# Patient Record
Sex: Male | Born: 1950 | Race: Black or African American | Hispanic: No | Marital: Married | Smoking: Former smoker
Health system: Southern US, Community
[De-identification: ages and names within clinical notes are randomized; demographics above are authoritative.]

## PROBLEM LIST (undated history)

## (undated) DIAGNOSIS — R7303 Prediabetes: Secondary | ICD-10-CM

## (undated) DIAGNOSIS — I10 Essential (primary) hypertension: Secondary | ICD-10-CM

## (undated) DIAGNOSIS — J189 Pneumonia, unspecified organism: Secondary | ICD-10-CM

## (undated) DIAGNOSIS — K219 Gastro-esophageal reflux disease without esophagitis: Secondary | ICD-10-CM

## (undated) DIAGNOSIS — M109 Gout, unspecified: Secondary | ICD-10-CM

## (undated) DIAGNOSIS — I2510 Atherosclerotic heart disease of native coronary artery without angina pectoris: Secondary | ICD-10-CM

## (undated) DIAGNOSIS — R609 Edema, unspecified: Secondary | ICD-10-CM

## (undated) DIAGNOSIS — Z8679 Personal history of other diseases of the circulatory system: Secondary | ICD-10-CM

## (undated) DIAGNOSIS — E669 Obesity, unspecified: Secondary | ICD-10-CM

## (undated) DIAGNOSIS — I739 Peripheral vascular disease, unspecified: Secondary | ICD-10-CM

## (undated) DIAGNOSIS — M1990 Unspecified osteoarthritis, unspecified site: Secondary | ICD-10-CM

## (undated) HISTORY — PX: OTHER SURGICAL HISTORY: SHX169

## (undated) HISTORY — PX: CYST REMOVAL TRUNK: SHX6283

## (undated) HISTORY — PX: CORONARY STENT PLACEMENT: SHX1402

## (undated) HISTORY — PX: FOOT SURGERY: SHX648

## (undated) HISTORY — DX: Obesity, unspecified: E66.9

## (undated) HISTORY — DX: Edema, unspecified: R60.9

---

## 1976-06-22 HISTORY — PX: OTHER SURGICAL HISTORY: SHX169

## 1976-06-22 HISTORY — PX: FOOT SURGERY: SHX648

## 1996-06-22 HISTORY — PX: CORONARY STENT PLACEMENT: SHX1402

## 1998-06-22 DIAGNOSIS — I219 Acute myocardial infarction, unspecified: Secondary | ICD-10-CM

## 1998-06-22 HISTORY — DX: Acute myocardial infarction, unspecified: I21.9

## 1999-01-14 ENCOUNTER — Inpatient Hospital Stay (HOSPITAL_COMMUNITY): Admission: AD | Admit: 1999-01-14 | Discharge: 1999-01-15 | Payer: Self-pay | Admitting: Cardiovascular Disease

## 1999-01-14 ENCOUNTER — Encounter: Payer: Self-pay | Admitting: Cardiovascular Disease

## 1999-03-03 ENCOUNTER — Ambulatory Visit (HOSPITAL_COMMUNITY): Admission: RE | Admit: 1999-03-03 | Discharge: 1999-03-03 | Payer: Self-pay | Admitting: Cardiovascular Disease

## 1999-03-03 ENCOUNTER — Encounter: Payer: Self-pay | Admitting: Cardiovascular Disease

## 1999-06-27 ENCOUNTER — Emergency Department (HOSPITAL_COMMUNITY): Admission: EM | Admit: 1999-06-27 | Discharge: 1999-06-27 | Payer: Self-pay | Admitting: *Deleted

## 2002-06-02 ENCOUNTER — Emergency Department (HOSPITAL_COMMUNITY): Admission: EM | Admit: 2002-06-02 | Discharge: 2002-06-03 | Payer: Self-pay | Admitting: Emergency Medicine

## 2003-08-15 ENCOUNTER — Inpatient Hospital Stay (HOSPITAL_COMMUNITY): Admission: EM | Admit: 2003-08-15 | Discharge: 2003-08-16 | Payer: Self-pay | Admitting: Emergency Medicine

## 2005-03-27 ENCOUNTER — Inpatient Hospital Stay (HOSPITAL_COMMUNITY): Admission: EM | Admit: 2005-03-27 | Discharge: 2005-03-29 | Payer: Self-pay | Admitting: *Deleted

## 2005-09-22 ENCOUNTER — Encounter (HOSPITAL_BASED_OUTPATIENT_CLINIC_OR_DEPARTMENT_OTHER): Admission: RE | Admit: 2005-09-22 | Discharge: 2005-11-18 | Payer: Self-pay | Admitting: Surgery

## 2005-10-01 ENCOUNTER — Ambulatory Visit: Admission: RE | Admit: 2005-10-01 | Discharge: 2005-10-01 | Payer: Self-pay | Admitting: Surgery

## 2012-04-11 ENCOUNTER — Encounter: Payer: Medicaid Other | Attending: Internal Medicine | Admitting: *Deleted

## 2012-04-11 ENCOUNTER — Encounter: Payer: Self-pay | Admitting: *Deleted

## 2012-04-11 VITALS — Ht 69.0 in | Wt 273.0 lb

## 2012-04-11 DIAGNOSIS — E1159 Type 2 diabetes mellitus with other circulatory complications: Secondary | ICD-10-CM

## 2012-04-11 DIAGNOSIS — Z713 Dietary counseling and surveillance: Secondary | ICD-10-CM | POA: Insufficient documentation

## 2012-04-11 DIAGNOSIS — E119 Type 2 diabetes mellitus without complications: Secondary | ICD-10-CM | POA: Insufficient documentation

## 2012-04-11 NOTE — Progress Notes (Signed)
Medical Nutrition Therapy:  Appt start time: 1030 end time:  1130.  Assessment:  Primary concerns today: Type 2 diabetes. Patient recently diagnosed with diabetes. No prior education. He reports he has made some healthy changes in order to lose weight. He does not currently check blood glucose, but wife has meter. HgbA1c 6.6. His total and LDL cholesterol are elevated. He is limited in physical activity due to foot pain, but would like to start biking.   WEIGHT: 273 pounds BMI 40.4  MEDICATIONS: lasix, metoprolol, lisinopril, omeprazole, Klor-Con Patient has not filled diabetes medication prescription at this time. He is unable to provide the name, but likely metformin.    DIETARY INTAKE:   Usual eating pattern includes 2-3 meals and 1-2 snacks per day.  24-hr recall:  B ( AM): Skips, green tea, bacon, eggs occationally  Snk ( AM): None  L ( PM): Large salad (dressing, tomatoes, deli meat), unsweetened tea Snk ( PM): hard candies, canned pineapple D ( PM): pork chops (x3), greens, pintos/black eyed peas/potatoes, water Snk ( PM): pineapple Beverages: unsweetened tea, water  Usual physical activity: None  Estimated energy needs: 2000 calories 225 g carbohydrates 150 g protein 56 g fat  Progress Towards Goal(s):  In progress.   Nutritional Diagnosis:  NB-1.1 Food and nutrition-related knowledge deficit As related to diabetes.  As evidenced by no prior education.    Intervention:  Nutrition counseling. We discussed basic carb counting, including foods with carbs, label reading, portion size, and meal planning. We also discussed strategies for weight loss and heart healthy eating.   Goals:  1. 3-4 carbohydrate servings at meals, 1 serving at snacks. 2. Monitor portion size of carbohydrate containing foods.  3. Reduce saturated fat intake by limiting meat portions.  4. Continue to eat at least 3 servings of vegetables daily.  5. Work up to biking at least 3 days a week for 30  minutes.   Handouts given during visit include:  Carbohydrate counting booklet  Monitoring/Evaluation:  Dietary intake, exercise, blood glucose, and body weight in 1 month(s).

## 2012-04-11 NOTE — Patient Instructions (Signed)
Goals:  1. 3-4 carbohydrate servings at meals, 1 serving at snacks. 2. Monitor portion size of carbohydrate containing foods.  3. Reduce saturated fat intake by limiting meat portions.  4. Continue to eat at least 3 servings of vegetables daily.  5. Work up to biking at least 3 days a week for 30 minutes.

## 2012-05-04 ENCOUNTER — Emergency Department (HOSPITAL_COMMUNITY): Payer: Medicaid Other

## 2012-05-04 ENCOUNTER — Encounter (HOSPITAL_COMMUNITY): Payer: Self-pay | Admitting: Emergency Medicine

## 2012-05-04 ENCOUNTER — Emergency Department (HOSPITAL_COMMUNITY)
Admission: EM | Admit: 2012-05-04 | Discharge: 2012-05-05 | Disposition: A | Discharge status: Home / Self Care | Payer: Medicaid Other | Attending: Emergency Medicine | Admitting: Emergency Medicine

## 2012-05-04 DIAGNOSIS — R748 Abnormal levels of other serum enzymes: Secondary | ICD-10-CM | POA: Insufficient documentation

## 2012-05-04 DIAGNOSIS — I10 Essential (primary) hypertension: Secondary | ICD-10-CM | POA: Insufficient documentation

## 2012-05-04 DIAGNOSIS — I2510 Atherosclerotic heart disease of native coronary artery without angina pectoris: Secondary | ICD-10-CM | POA: Insufficient documentation

## 2012-05-04 DIAGNOSIS — R7989 Other specified abnormal findings of blood chemistry: Secondary | ICD-10-CM

## 2012-05-04 DIAGNOSIS — Z79899 Other long term (current) drug therapy: Secondary | ICD-10-CM | POA: Insufficient documentation

## 2012-05-04 DIAGNOSIS — R079 Chest pain, unspecified: Secondary | ICD-10-CM

## 2012-05-04 DIAGNOSIS — Z87891 Personal history of nicotine dependence: Secondary | ICD-10-CM | POA: Insufficient documentation

## 2012-05-04 DIAGNOSIS — E119 Type 2 diabetes mellitus without complications: Secondary | ICD-10-CM | POA: Insufficient documentation

## 2012-05-04 DIAGNOSIS — Z7982 Long term (current) use of aspirin: Secondary | ICD-10-CM | POA: Insufficient documentation

## 2012-05-04 HISTORY — DX: Atherosclerotic heart disease of native coronary artery without angina pectoris: I25.10

## 2012-05-04 HISTORY — DX: Essential (primary) hypertension: I10

## 2012-05-04 LAB — CBC WITH DIFFERENTIAL/PLATELET
Basophils Absolute: 0 10*3/uL (ref 0.0–0.1)
Eosinophils Absolute: 0.1 10*3/uL (ref 0.0–0.7)
Eosinophils Relative: 1 % (ref 0–5)
Lymphocytes Relative: 30 % (ref 12–46)
MCV: 82.3 fL (ref 78.0–100.0)
Neutrophils Relative %: 61 % (ref 43–77)
Platelets: 300 10*3/uL (ref 150–400)
RDW: 13.7 % (ref 11.5–15.5)
WBC: 9.1 10*3/uL (ref 4.0–10.5)

## 2012-05-04 LAB — BASIC METABOLIC PANEL
CO2: 27 mEq/L (ref 19–32)
Calcium: 9.9 mg/dL (ref 8.4–10.5)
GFR calc non Af Amer: 28 mL/min — ABNORMAL LOW (ref >90)
Potassium: 3.6 mEq/L (ref 3.5–5.1)
Sodium: 133 mEq/L — ABNORMAL LOW (ref 135–145)

## 2012-05-04 LAB — TROPONIN I: Troponin I: <0.3 ng/mL (ref <0.30)

## 2012-05-04 LAB — POCT I-STAT TROPONIN I

## 2012-05-04 NOTE — ED Notes (Signed)
Pt sts episode of CP today this am with resolution; pt denies SOB or nausea; pt sts feels "out of place" or weak today

## 2012-05-04 NOTE — ED Provider Notes (Signed)
History     CSN: [id number]  Arrival date & time 05/04/12  4098   First MD Initiated Contact with Patient 05/04/12 2116      Chief Complaint  Patient presents with  . Chest Pain    (Consider location/radiation/quality/duration/timing/severity/associated sxs/prior treatment) The history is provided by the patient.  Christian Evans is a 61 y.o. male hx of CAD s/p 1 stent, DM, HTN here with chest pain. Substernal chest pain lasting 15-20 minutes this AM. He thought it was gas so took some medicines and felt better. Denies SOB or nausea or diaphoresis. Felt weak this PM so wanted to get checked out. He has been following up with his cardiologist and had a normal cardiac cath several years ago. No fever or chills or cough. Pain free now and he took his ASA 325 mg today.    Past Medical History  Diagnosis Date  . Coronary artery disease   . Diabetes mellitus without complication   . Hypertension     History reviewed. No pertinent past surgical history.  History reviewed. No pertinent family history.  History  Substance Use Topics  . Smoking status: Former Games developer  . Smokeless tobacco: Not on file  . Alcohol Use: Yes     Comment: occ      Review of Systems  Cardiovascular: Positive for chest pain.  All other systems reviewed and are negative.    Allergies  Review of patient's allergies indicates no known allergies.  Home Medications   Current Outpatient Rx  Name  Route  Sig  Dispense  Refill  . ASPIRIN 325 MG PO TABS   Oral   Take 325 mg by mouth daily.         Marland Kitchen CLONIDINE HCL 0.1 MG PO TABS   Oral   Take 0.1 mg by mouth 2 (two) times daily.         Marland Kitchen FELODIPINE ER 10 MG PO TB24   Oral   Take 10 mg by mouth daily.         . FUROSEMIDE 20 MG PO TABS   Oral   Take 20 mg by mouth daily.         Marland Kitchen LISINOPRIL-HYDROCHLOROTHIAZIDE 20-25 MG PO TABS   Oral   Take 1 tablet by mouth daily.         Marland Kitchen METHOCARBAMOL 500 MG PO TABS   Oral   Take 500 mg  by mouth 3 (three) times daily.         Marland Kitchen METOPROLOL SUCCINATE ER 25 MG PO TB24   Oral   Take 25 mg by mouth daily.         Marland Kitchen OMEPRAZOLE 20 MG PO CPDR   Oral   Take 20 mg by mouth daily.         Marland Kitchen POTASSIUM CHLORIDE CRYS ER 20 MEQ PO TBCR   Oral   Take 20 mEq by mouth 2 (two) times daily.         . TESTOSTERONE 2.5 MG/24HR TD PT24   Transdermal   Place 1 patch onto the skin daily.           BP 142/82  Pulse 85  Temp 97.9 F (36.6 C) (Oral)  Resp 18  SpO2 98%  Physical Exam  Nursing note and vitals reviewed. Constitutional: He appears well-developed and well-nourished.       NAD, calm   HENT:  Head: Normocephalic.  Mouth/Throat: Oropharynx is clear and moist.  Eyes: Conjunctivae normal  are normal. Pupils are equal, round, and reactive to light.  Neck: Normal range of motion. Neck supple.  Cardiovascular: Normal rate, regular rhythm and normal heart sounds.   Pulmonary/Chest: Effort normal and breath sounds normal. No respiratory distress. He has no wheezes.  Abdominal: Soft. Bowel sounds are normal. He exhibits no distension. There is no tenderness. There is no rebound.  Musculoskeletal: Normal range of motion. He exhibits edema. He exhibits no tenderness.       1+ edema, chronic   Neurological: He is alert.  Skin: Skin is warm and dry.  Psychiatric: He has a normal mood and affect. His behavior is normal. Judgment and thought content normal.    ED Course  Procedures (including critical care time)  Labs Reviewed  BASIC METABOLIC PANEL - Abnormal; Notable for the following:    Sodium 133 (*)     Chloride 92 (*)     BUN 42 (*)     Creatinine, Ser 2.40 (*)     GFR calc non Af Amer 28 (*)     GFR calc Af Amer 32 (*)     All other components within normal limits  CBC WITH DIFFERENTIAL  POCT I-STAT TROPONIN I  TROPONIN I   Dg Chest 2 View  05/04/2012  *RADIOLOGY REPORT*  Clinical Data: Chest pain.  CHEST - 2 VIEW  Comparison: Two-view chest  03/26/2005.  Findings: Heart size is normal.  Lungs are clear.  Mild degenerative changes in the thoracic spine are similar to the prior exam.  IMPRESSION: No acute cardiopulmonary disease or significant interval change.   Original Report Authenticated By: Marin Roberts, M.D.      No diagnosis found.   Date: 05/04/2012  Rate: 81  Rhythm: normal sinus rhythm  QRS Axis: normal  Intervals: normal  ST/T Wave abnormalities: nonspecific ST changes  Conduction Disutrbances:none  Narrative Interpretation:   Old EKG Reviewed: none available     MDM  Christian Evans is a 61 y.o. male hx of CAD s/p 1 stent, DM here with chest pain that resolved. I counseled patient regarding options. Since he has follow up and is pain free and has nl EKG, will recommend Trop x 2 and outpatient f/u if negative.   11:12 PM Trop neg x 2. CXR nl. Labs show Cr 2.4 (unknown baseline). Will have him follow up with his doctor outpatient.        Richardean Canal, MD 05/04/12 [id number]

## 2012-05-09 ENCOUNTER — Ambulatory Visit: Payer: Medicaid Other | Admitting: *Deleted

## 2012-05-23 ENCOUNTER — Ambulatory Visit: Payer: Medicaid Other | Admitting: *Deleted

## 2012-06-06 ENCOUNTER — Encounter: Payer: Medicaid Other | Attending: Internal Medicine | Admitting: *Deleted

## 2012-06-06 ENCOUNTER — Encounter: Payer: Self-pay | Admitting: *Deleted

## 2012-06-06 VITALS — Ht 69.0 in | Wt 269.5 lb

## 2012-06-06 DIAGNOSIS — E119 Type 2 diabetes mellitus without complications: Secondary | ICD-10-CM | POA: Insufficient documentation

## 2012-06-06 DIAGNOSIS — Z713 Dietary counseling and surveillance: Secondary | ICD-10-CM | POA: Insufficient documentation

## 2012-06-06 NOTE — Progress Notes (Signed)
Medical Nutrition Therapy:  Appt start time: 0830 end time:  0900.  Assessment:  Patient returns today for a diabetes management follow up. He reports that he has been monitoring carbohydrate intake and portion size of foods. He states that he got off track over Thanksgiving, but is back on track now. His weight is down to 269.6 pounds from 273 pounds 2 months ago. He checks his blood glucose twice daily, in the morning and 2 hours after dinner. Usual readings are 87-127.   MEDICATIONS: lasix, metoprolol, lisinopril, omeprazole, Klor-Con No diabetes medications at this time.    DIETARY INTAKE:   Usual eating pattern includes 3 meals and 1-2 snacks per day.  Usual physical activity: None currently, but would like to buy a bike  Estimated energy needs: 2000 calories  225 g carbohydrates  150 g protein  56 g fat  Progress Towards Goal(s):  Some progress.   Nutritional Diagnosis:  NB-1.1 Food and nutrition-related knowledge deficit As related to diabetes. As evidenced by no prior education.    Intervention:  Nutrition counseling. We reviewed basic carb counting principles. I reinforced the positive changes the patient has made, and discussed adding physical activity to his daily life. We also discussed medications and blood glucose monitoring.   Goals:  1. 3-4 carbohydrate servings at meals, 1 serving at snacks.  2. Monitor portion size of carbohydrate containing foods.  3. Reduce saturated fat intake by limiting meat portions.  4. Continue to eat at least 3 servings of vegetables daily.  5. Work up to biking at least 3 days a week for 30 minutes.   Monitoring/Evaluation:  Dietary intake, exercise, blood glucose, and body weight in 2 month(s).

## 2012-07-25 ENCOUNTER — Ambulatory Visit: Payer: Medicaid Other | Admitting: *Deleted

## 2012-08-08 ENCOUNTER — Ambulatory Visit: Payer: Medicaid Other | Admitting: *Deleted

## 2013-05-28 ENCOUNTER — Emergency Department (HOSPITAL_COMMUNITY)
Admission: EM | Admit: 2013-05-28 | Discharge: 2013-05-28 | Disposition: A | Discharge status: Home / Self Care | Payer: BC Managed Care – PPO | Source: Home / Self Care

## 2013-05-28 ENCOUNTER — Encounter (HOSPITAL_COMMUNITY): Payer: Self-pay | Admitting: Emergency Medicine

## 2013-05-28 DIAGNOSIS — T148XXA Other injury of unspecified body region, initial encounter: Secondary | ICD-10-CM

## 2013-05-28 DIAGNOSIS — R079 Chest pain, unspecified: Secondary | ICD-10-CM

## 2013-05-28 DIAGNOSIS — R0781 Pleurodynia: Secondary | ICD-10-CM

## 2013-05-28 DIAGNOSIS — I10 Essential (primary) hypertension: Secondary | ICD-10-CM

## 2013-05-28 HISTORY — DX: Gastro-esophageal reflux disease without esophagitis: K21.9

## 2013-05-28 HISTORY — DX: Personal history of other diseases of the circulatory system: Z86.79

## 2013-05-28 HISTORY — DX: Gout, unspecified: M10.9

## 2013-05-28 MED ORDER — TRAMADOL HCL 50 MG PO TABS: 50.0000 mg | ORAL_TABLET | Freq: Four times a day (QID) | ORAL | Status: DC | PRN

## 2013-05-28 NOTE — ED Provider Notes (Signed)
Medical screening examination/treatment/procedure(s) were performed by resident physician or non-physician practitioner and as supervising physician I was immediately available for consultation/collaboration.   Khristy Kalan DOUGLAS MD.   Mickey Hebel D Taygen Acklin, MD 05/28/13 1412 

## 2013-05-28 NOTE — ED Provider Notes (Signed)
CSN: [id number]     Arrival date & time 05/28/13  0940 History   First MD Initiated Contact with Patient 05/28/13 1010     Chief Complaint  Patient presents with  . Chest Pain  . Back Pain   (Consider location/radiation/quality/duration/timing/severity/associated sxs/prior Treatment) HPI Comments: As above. Sudden onset of pain in bilateral ribs when reaching backwards with R arm 3 d ago. Pain located along the L and right ribs along the MAL. Elicited with movement of arms and torso. Abated with arms down and relaxed. No anterior chest discomfort. No Heaviness, tightness, fullness or pressure, dyspnea, diaphoresis, weakness.   Past Medical History  Diagnosis Date  . Coronary artery disease   . Hypertension   . GERD (gastroesophageal reflux disease)   . Gout   . Hx of angina pectoris    Past Surgical History  Procedure Laterality Date  . Coronary stent placement    . Foot surgery    . Arm surgery     No family history on file. History  Substance Use Topics  . Smoking status: Former Games developer  . Smokeless tobacco: Not on file  . Alcohol Use: No    Review of Systems  Constitutional: Negative.  Negative for fever, chills, diaphoresis, activity change, appetite change and fatigue.  HENT: Negative.   Respiratory: Negative.  Negative for cough, choking, chest tightness, shortness of breath, wheezing and stridor.   Cardiovascular: Positive for leg swelling. Negative for chest pain and palpitations.       Edema bilat LE is chronic per PCP notes.  Gastrointestinal: Negative.   Genitourinary: Negative.   Musculoskeletal: Negative for neck pain.       As per HPI  Skin: Negative.   Neurological: Negative for dizziness, weakness, numbness and headaches.    Allergies  Review of patient's allergies indicates no known allergies.  Home Medications   Current Outpatient Rx  Name  Route  Sig  Dispense  Refill  . aspirin 325 MG tablet   Oral   Take 325 mg by mouth daily.          . cloNIDine (CATAPRES) 0.1 MG tablet   Oral   Take 0.1 mg by mouth 2 (two) times daily.         . felodipine (PLENDIL) 10 MG 24 hr tablet   Oral   Take 10 mg by mouth daily.         Marland Kitchen lisinopril-hydrochlorothiazide (PRINZIDE,ZESTORETIC) 20-25 MG per tablet   Oral   Take 1 tablet by mouth daily.         . metoprolol succinate (TOPROL-XL) 25 MG 24 hr tablet   Oral   Take 25 mg by mouth daily.         . furosemide (LASIX) 20 MG tablet   Oral   Take 20 mg by mouth daily.         . methocarbamol (ROBAXIN) 500 MG tablet   Oral   Take 500 mg by mouth 3 (three) times daily.         Marland Kitchen omeprazole (PRILOSEC) 20 MG capsule   Oral   Take 20 mg by mouth daily.         . potassium chloride SA (K-DUR,KLOR-CON) 20 MEQ tablet   Oral   Take 20 mEq by mouth 2 (two) times daily.         Marland Kitchen testosterone (ANDRODERM) 2.5 MG/24HR   Transdermal   Place 1 patch onto the skin daily.         Marland Kitchen  traMADol (ULTRAM) 50 MG tablet   Oral   Take 1 tablet (50 mg total) by mouth every 6 (six) hours as needed.   20 tablet   0    BP 171/100  Pulse 61  Temp(Src) 98 F (36.7 C) (Oral)  Resp 18  SpO2 97% Physical Exam  Nursing note and vitals reviewed. Constitutional: He is oriented to person, place, and time. He appears well-developed and well-nourished. No distress.  HENT:  Head: Normocephalic and atraumatic.  Eyes: Conjunctivae and EOM are normal. Left eye exhibits no discharge.  Neck: Normal range of motion. Neck supple.  Cardiovascular: Normal rate, regular rhythm, normal heart sounds and intact distal pulses.   Pulmonary/Chest: Effort normal and breath sounds normal. No respiratory distress. He has no wheezes. He has no rales. He exhibits tenderness.  Tenderness along the lower bilateral ribs primarily below the axillae and MAL. No mid back tenderness or pain.   Musculoskeletal: Normal range of motion. He exhibits tenderness.  Lymphadenopathy:    He has no cervical  adenopathy.  Neurological: He is alert and oriented to person, place, and time. No cranial nerve deficit.  Skin: Skin is warm and dry.  Psychiatric: He has a normal mood and affect.    ED Course  Procedures (including critical care time) Labs Review Labs Reviewed - No data to display Imaging Review No results found.  EKG: NSR, No ectopy. No change from November EKG.  MDM   1. Rib pain   2. Muscle strain   3. HTN (hypertension)     Pt D/C in good condition. There are no cardiopulmonary sx's.  Tramadol for pain , may combine with ibuprofen or tylenol. See your doctor as needed.     Hayden Rasmussen, NP 05/28/13 1108  Hayden Rasmussen, NP 05/28/13 1109

## 2013-05-28 NOTE — ED Notes (Signed)
States reached back with right arm 3 days ago and felt sudden onset right shoulder pain.  Constant pain radiates down and around ribcage and around to lateral mid-back.  Pain is aggravated by any movement, twisting, or reaching up with arms.  Denies any SOB, nausea, or diaphoresis associated with pain.  Pt guarded with his movements.  Has tried IBU, but no relief.  Had difficulty getting out of bed.  Pt has not had to use NTG.

## 2013-06-01 ENCOUNTER — Emergency Department (INDEPENDENT_AMBULATORY_CARE_PROVIDER_SITE_OTHER)
Admission: EM | Admit: 2013-06-01 | Discharge: 2013-06-01 | Disposition: A | Discharge status: Home / Self Care | Payer: BC Managed Care – PPO | Source: Home / Self Care | Attending: Emergency Medicine | Admitting: Emergency Medicine

## 2013-06-01 ENCOUNTER — Encounter (HOSPITAL_COMMUNITY): Payer: Self-pay | Admitting: Emergency Medicine

## 2013-06-01 DIAGNOSIS — J069 Acute upper respiratory infection, unspecified: Secondary | ICD-10-CM

## 2013-06-01 MED ORDER — BENZONATATE 100 MG PO CAPS: 100.0000 mg | ORAL_CAPSULE | Freq: Three times a day (TID) | ORAL | Status: DC

## 2013-06-01 NOTE — ED Provider Notes (Signed)
Medical screening examination/treatment/procedure(s) were performed by non-physician practitioner and as supervising physician I was immediately available for consultation/collaboration.  Leslee Home, M.D.  Reuben Likes, MD 06/01/13 2152

## 2013-06-01 NOTE — ED Provider Notes (Signed)
CSN: [id number]     Arrival date & time 06/01/13  1655 History   First MD Initiated Contact with Patient 06/01/13 1712     Chief Complaint  Patient presents with  . Cough   (Consider location/radiation/quality/duration/timing/severity/associated sxs/prior Treatment) Patient is a 62 y.o. male presenting with URI. The history is provided by the patient.  URI Presenting symptoms: congestion and cough   Presenting symptoms: no fever, no rhinorrhea and no sore throat   Severity:  Mild Onset quality:  Gradual Duration:  2 days Progression:  Unchanged Chronicity:  New Associated symptoms: no headaches and no wheezing   Risk factors: sick contacts   Risk factors comment:  Was recently caring for several children who were ill with URI symptoms   Past Medical History  Diagnosis Date  . Coronary artery disease   . Hypertension   . GERD (gastroesophageal reflux disease)   . Gout   . Hx of angina pectoris    Past Surgical History  Procedure Laterality Date  . Coronary stent placement    . Foot surgery    . Arm surgery     No family history on file. History  Substance Use Topics  . Smoking status: Former Games developer  . Smokeless tobacco: Not on file  . Alcohol Use: No    Review of Systems  Constitutional: Negative for fever.  HENT: Positive for congestion. Negative for rhinorrhea and sore throat.   Eyes: Negative.   Respiratory: Positive for cough. Negative for chest tightness, shortness of breath and wheezing.   Cardiovascular: Negative.   Gastrointestinal: Negative.   Endocrine: Negative for polydipsia, polyphagia and polyuria.  Genitourinary: Negative.   Musculoskeletal: Negative.   Skin: Negative.   Allergic/Immunologic: Negative for immunocompromised state.  Neurological: Negative for dizziness, weakness and headaches.  Psychiatric/Behavioral: Negative.     Allergies  Review of patient's allergies indicates no known allergies.  Home Medications   Current Outpatient  Rx  Name  Route  Sig  Dispense  Refill  . aspirin 325 MG tablet   Oral   Take 325 mg by mouth daily.         . cloNIDine (CATAPRES) 0.1 MG tablet   Oral   Take 0.1 mg by mouth 2 (two) times daily.         . felodipine (PLENDIL) 10 MG 24 hr tablet   Oral   Take 10 mg by mouth daily.         . furosemide (LASIX) 20 MG tablet   Oral   Take 20 mg by mouth daily.         Marland Kitchen lisinopril-hydrochlorothiazide (PRINZIDE,ZESTORETIC) 20-25 MG per tablet   Oral   Take 1 tablet by mouth daily.         . methocarbamol (ROBAXIN) 500 MG tablet   Oral   Take 500 mg by mouth 3 (three) times daily.         . metoprolol succinate (TOPROL-XL) 25 MG 24 hr tablet   Oral   Take 25 mg by mouth daily.         Marland Kitchen omeprazole (PRILOSEC) 20 MG capsule   Oral   Take 20 mg by mouth daily.         . potassium chloride SA (K-DUR,KLOR-CON) 20 MEQ tablet   Oral   Take 20 mEq by mouth 2 (two) times daily.         Marland Kitchen testosterone (ANDRODERM) 2.5 MG/24HR   Transdermal   Place 1 patch onto the skin  daily.         . traMADol (ULTRAM) 50 MG tablet   Oral   Take 1 tablet (50 mg total) by mouth every 6 (six) hours as needed.   20 tablet   0    BP 148/81  Pulse 95  Temp(Src) 97.8 F (36.6 C) (Oral)  Resp 16  SpO2 95% Physical Exam  Nursing note and vitals reviewed. Constitutional: He is oriented to person, place, and time. He appears well-developed and well-nourished. No distress.  +obese   HENT:  Head: Normocephalic and atraumatic.  Right Ear: Hearing, tympanic membrane, external ear and ear canal normal.  Left Ear: Hearing, tympanic membrane, external ear and ear canal normal.  Nose: Nose normal.  Mouth/Throat: Uvula is midline, oropharynx is clear and moist and mucous membranes are normal.  Eyes: Conjunctivae are normal. Right eye exhibits no discharge. Left eye exhibits no discharge. No scleral icterus.  Neck: Normal range of motion. Neck supple.  Cardiovascular: Normal  rate, regular rhythm and normal heart sounds.   Pulmonary/Chest: Effort normal and breath sounds normal.  Abdominal: Soft. Bowel sounds are normal.  Musculoskeletal: Normal range of motion.  Lymphadenopathy:    He has no cervical adenopathy.  Neurological: He is alert and oriented to person, place, and time.  Skin: Skin is warm and dry. No rash noted.  Psychiatric: He has a normal mood and affect. His behavior is normal.    ED Course  Procedures (including critical care time) Labs Review Labs Reviewed - No data to display Imaging Review No results found.  EKG Interpretation    Date/Time:    Ventricular Rate:    PR Interval:    QRS Duration:   QT Interval:    QTC Calculation:   R Axis:     Text Interpretation:              MDM   Patient educated regarding symptomatic care at home and the use of OTC cough and cold products and advised to return if fever or dyspnea. Advised to get his 2014 flu shot once he begin to feel better.    Jess Barters [place], [place] 06/01/13 1816

## 2013-06-01 NOTE — ED Notes (Signed)
Seen  3  Days  Ago  For  Muscle    Strain        -  Now  Developed  A  Cough     Today            Head  Stuffy     Nasal  Congestion

## 2013-09-04 ENCOUNTER — Emergency Department (HOSPITAL_COMMUNITY)
Admission: EM | Admit: 2013-09-04 | Discharge: 2013-09-04 | Disposition: A | Discharge status: Home / Self Care | Payer: BC Managed Care – PPO | Source: Home / Self Care

## 2013-09-04 DIAGNOSIS — M7541 Impingement syndrome of right shoulder: Secondary | ICD-10-CM

## 2013-09-04 DIAGNOSIS — M7580 Other shoulder lesions, unspecified shoulder: Secondary | ICD-10-CM

## 2013-09-04 DIAGNOSIS — M25819 Other specified joint disorders, unspecified shoulder: Secondary | ICD-10-CM

## 2013-09-04 MED ORDER — MELOXICAM 15 MG PO TABS: 15.0000 mg | ORAL_TABLET | Freq: Every day | ORAL | Status: DC

## 2013-09-04 MED ORDER — TRAMADOL HCL 50 MG PO TABS: 50.0000 mg | ORAL_TABLET | Freq: Four times a day (QID) | ORAL | Status: DC | PRN

## 2013-09-04 MED ORDER — TRIAMCINOLONE ACETONIDE 40 MG/ML IJ SUSP
INTRAMUSCULAR | Status: AC
  Filled 2013-09-04: qty 1

## 2013-09-04 NOTE — ED Notes (Signed)
63 yr old is here with complaints of Right shoulder pain x 4 wks. He states he has not fallen on injuired his right shoulder. He states he has tried OTC Tylenol with no relief.  Denies: SOB, chest pain, fever

## 2013-09-04 NOTE — Discharge Instructions (Signed)

## 2013-09-04 NOTE — ED Provider Notes (Signed)
CSN: [id number]     Arrival date & time 09/04/13  [id number] History   First MD Initiated Contact with Patient 09/04/13 [phone]     No chief complaint on file.  (Consider location/radiation/quality/duration/timing/severity/associated sxs/prior Treatment) HPI Comments: 63 year old male complaining of pain to the right shoulder. The pain is elicited with abduction. Abduction is limited to approximately 80-90 due to pain. The initial symptoms occurred approximately 4 weeks ago. There is no history of trauma and he is unaware of any specific causative condition.   Past Medical History  Diagnosis Date  . Coronary artery disease   . Hypertension   . GERD (gastroesophageal reflux disease)   . Gout   . Hx of angina pectoris    Past Surgical History  Procedure Laterality Date  . Coronary stent placement    . Foot surgery    . Arm surgery     No family history on file. History  Substance Use Topics  . Smoking status: Former Games developermoker  . Smokeless tobacco: Not on file  . Alcohol Use: No    Review of Systems  Constitutional: Negative.   Respiratory: Negative.   Gastrointestinal: Negative.   Genitourinary: Negative.   Musculoskeletal: Negative for neck pain and neck stiffness.       As per HPI  Skin: Negative.   Neurological: Negative for dizziness, weakness, numbness and headaches.    Allergies  Review of patient's allergies indicates no known allergies.  Home Medications   Current Outpatient Rx  Name  Route  Sig  Dispense  Refill  . allopurinol (ZYLOPRIM) 300 MG tablet   Oral   Take 300 mg by mouth daily.         Marland Kitchen. aspirin 325 MG tablet   Oral   Take 325 mg by mouth daily.         . cloNIDine (CATAPRES) 0.1 MG tablet   Oral   Take 0.1 mg by mouth 2 (two) times daily.         . felodipine (PLENDIL) 10 MG 24 hr tablet   Oral   Take 10 mg by mouth daily.         Marland Kitchen. lisinopril-hydrochlorothiazide (PRINZIDE,ZESTORETIC) 20-25 MG per tablet   Oral   Take 1 tablet by mouth  daily.         . metoprolol succinate (TOPROL-XL) 25 MG 24 hr tablet   Oral   Take 25 mg by mouth daily.         . furosemide (LASIX) 20 MG tablet   Oral   Take 20 mg by mouth daily.         . meloxicam (MOBIC) 15 MG tablet   Oral   Take 1 tablet (15 mg total) by mouth daily.   14 tablet   0   . potassium chloride SA (K-DUR,KLOR-CON) 20 MEQ tablet   Oral   Take 20 mEq by mouth 2 (two) times daily.         . traMADol (ULTRAM) 50 MG tablet   Oral   Take 1 tablet (50 mg total) by mouth every 6 (six) hours as needed.   15 tablet   0    BP 146/85  Pulse 60  Temp(Src) 97.7 F (36.5 C) (Oral)  Resp 18  SpO2 98% Physical Exam  Nursing note and vitals reviewed. Constitutional: He is oriented to person, place, and time. He appears well-developed and well-nourished.  HENT:  Head: Normocephalic and atraumatic.  Eyes: EOM are normal. Left eye exhibits no  discharge.  Neck: Normal range of motion. Neck supple.  Pulmonary/Chest: Effort normal. No respiratory distress.  Musculoskeletal:  Tenderness over the right a.c. joint, but no weight and uppermost deltoid insertion points. Abduction is limited to 80-90. External and internal rotation also produces pain. Distal neurovascular and motor sensory is intact. 2+ radial pulse.  Neurological: He is alert and oriented to person, place, and time. No cranial nerve deficit.  Skin: Skin is warm and dry.  Psychiatric: He has a normal mood and affect.    ED Course  Injection of joint Date/Time: 09/04/2013 10:29 AM Performed by: Phineas Real, Debra Calabretta Authorized by: Leslee Home C Consent: Verbal consent obtained. Risks and benefits: risks, benefits and alternatives were discussed Consent given by: patient Patient understanding: patient states understanding of the procedure being performed Patient identity confirmed: verbally with patient Local anesthesia used: yes Anesthesia: local infiltration Local anesthetic: bupivacaine 0.5% without  epinephrine Anesthetic total: 3 ml Patient sedated: no Patient tolerance: Patient tolerated the procedure well with no immediate complications. Comments: Kenalog 40 mg and Marcaine 3 cc injected into the right a.c. joint.   (including critical care time) Labs Review Labs Reviewed - No data to display Imaging Review No results found.   MDM   1. Impingement syndrome of right shoulder region      Injectin of Kenalog 40 mg and Marcaine 3 cc as above Mobic 15 mg Passive ROM of shoulder Instructions for condition See your PCP next week as needed    Hayden Rasmussen, NP 09/04/13 1030

## 2013-09-04 NOTE — ED Provider Notes (Signed)
Medical screening examination/treatment/procedure(s) were performed by non-physician practitioner and as supervising physician I was immediately available for consultation/collaboration.  Leslee Homeavid Salimatou Simone, M.D.  Reuben Likesavid C Nicholl Onstott, MD 09/04/13 [phone]

## 2014-11-15 ENCOUNTER — Emergency Department (HOSPITAL_COMMUNITY)
Admission: EM | Admit: 2014-11-15 | Discharge: 2014-11-16 | Disposition: A | Discharge status: Home / Self Care | Payer: Medicare HMO | Attending: Emergency Medicine | Admitting: Emergency Medicine

## 2014-11-15 ENCOUNTER — Encounter (HOSPITAL_COMMUNITY): Payer: Self-pay | Admitting: Emergency Medicine

## 2014-11-15 ENCOUNTER — Emergency Department (HOSPITAL_COMMUNITY): Payer: Medicare HMO

## 2014-11-15 DIAGNOSIS — Z87891 Personal history of nicotine dependence: Secondary | ICD-10-CM | POA: Diagnosis not present

## 2014-11-15 DIAGNOSIS — Z8719 Personal history of other diseases of the digestive system: Secondary | ICD-10-CM | POA: Insufficient documentation

## 2014-11-15 DIAGNOSIS — I10 Essential (primary) hypertension: Secondary | ICD-10-CM | POA: Insufficient documentation

## 2014-11-15 DIAGNOSIS — R079 Chest pain, unspecified: Secondary | ICD-10-CM | POA: Diagnosis present

## 2014-11-15 DIAGNOSIS — M109 Gout, unspecified: Secondary | ICD-10-CM | POA: Insufficient documentation

## 2014-11-15 DIAGNOSIS — Z7982 Long term (current) use of aspirin: Secondary | ICD-10-CM | POA: Diagnosis not present

## 2014-11-15 DIAGNOSIS — I25119 Atherosclerotic heart disease of native coronary artery with unspecified angina pectoris: Secondary | ICD-10-CM | POA: Diagnosis not present

## 2014-11-15 DIAGNOSIS — Z79899 Other long term (current) drug therapy: Secondary | ICD-10-CM | POA: Diagnosis not present

## 2014-11-15 DIAGNOSIS — R0789 Other chest pain: Secondary | ICD-10-CM | POA: Diagnosis not present

## 2014-11-15 DIAGNOSIS — Z955 Presence of coronary angioplasty implant and graft: Secondary | ICD-10-CM | POA: Diagnosis not present

## 2014-11-15 LAB — I-STAT TROPONIN, ED
TROPONIN I, POC: 0 ng/mL (ref 0.00–0.08)
Troponin i, poc: 0 ng/mL (ref 0.00–0.08)

## 2014-11-15 LAB — BASIC METABOLIC PANEL
ANION GAP: 13 (ref 5–15)
BUN: 10 mg/dL (ref 6–20)
CHLORIDE: 99 mmol/L — AB (ref 101–111)
CO2: 24 mmol/L (ref 22–32)
Calcium: 9.4 mg/dL (ref 8.9–10.3)
Creatinine, Ser: 0.76 mg/dL (ref 0.61–1.24)
Glucose, Bld: 96 mg/dL (ref 65–99)
POTASSIUM: 3.6 mmol/L (ref 3.5–5.1)
Sodium: 136 mmol/L (ref 135–145)

## 2014-11-15 LAB — DIFFERENTIAL
BASOS ABS: 0 10*3/uL (ref 0.0–0.1)
Basophils Relative: 0 % (ref 0–1)
EOS ABS: 0 10*3/uL (ref 0.0–0.7)
EOS PCT: 0 % (ref 0–5)
Lymphocytes Relative: 14 % (ref 12–46)
Lymphs Abs: 2.1 10*3/uL (ref 0.7–4.0)
MONO ABS: 0.8 10*3/uL (ref 0.1–1.0)
MONOS PCT: 5 % (ref 3–12)
NEUTROS ABS: 11.8 10*3/uL — AB (ref 1.7–7.7)
NEUTROS PCT: 81 % — AB (ref 43–77)

## 2014-11-15 LAB — CBC
HCT: 45.4 % (ref 39.0–52.0)
HEMOGLOBIN: 15 g/dL (ref 13.0–17.0)
MCH: 28.5 pg (ref 26.0–34.0)
MCHC: 33 g/dL (ref 30.0–36.0)
MCV: 86.3 fL (ref 78.0–100.0)
PLATELETS: 284 10*3/uL (ref 150–400)
RBC: 5.26 MIL/uL (ref 4.22–5.81)
RDW: 14.3 % (ref 11.5–15.5)
WBC: 15.4 10*3/uL — ABNORMAL HIGH (ref 4.0–10.5)

## 2014-11-15 LAB — D-DIMER, QUANTITATIVE (NOT AT ARMC): D DIMER QUANT: 0.45 ug{FEU}/mL (ref 0.00–0.48)

## 2014-11-15 MED ORDER — KETOROLAC TROMETHAMINE 30 MG/ML IJ SOLN
30.0000 mg | Freq: Once | INTRAMUSCULAR | Status: AC
  Administered 2014-11-15: 30 mg via INTRAVENOUS
  Filled 2014-11-15: qty 1

## 2014-11-15 NOTE — ED Notes (Signed)
Pt currently in exray 

## 2014-11-15 NOTE — Discharge Instructions (Signed)

## 2014-11-15 NOTE — ED Notes (Signed)
Lab collected early per Dr. Elesa MassedWard.

## 2014-11-15 NOTE — ED Provider Notes (Signed)
TIME SEEN: 9:10 PM  CHIEF COMPLAINT: Chest pain  HPI: Pt is a 64 y.o. male with history of coronary artery disease with stent placement, hypertension, GERD who presents to the emergency department with chest pain that started last night. He is unable to describe the character he states it is only present with inspiration. It is not exertional. Denies shortness of breath, nausea, vomiting, diaphoresis or dizziness. States it does not feel like the pain he had when he had his stent placed. States he has been coughing recently and had yellow sputum production. Denies any fever. States his wife thinks that this pain is secondary from coughing. No history of DVT or PE. No recent prolonged immobilization such as hospitalization or fracture, surgery, trauma. No lower extremity swelling or pain.  ROS: See HPI Constitutional: no fever  Eyes: no drainage  ENT: no runny nose   Cardiovascular:  chest pain  Resp: no SOB  GI: no vomiting GU: no dysuria Integumentary: no rash  Allergy: no hives  Musculoskeletal: no leg swelling  Neurological: no slurred speech ROS otherwise negative  PAST MEDICAL HISTORY/PAST SURGICAL HISTORY:  Past Medical History  Diagnosis Date  . Coronary artery disease   . Hypertension   . GERD (gastroesophageal reflux disease)   . Gout   . Hx of angina pectoris     MEDICATIONS:  Prior to Admission medications   Medication Sig Start Date End Date Taking? Authorizing Provider  allopurinol (ZYLOPRIM) 300 MG tablet Take 300 mg by mouth daily.   Yes Historical Provider, MD  aspirin 325 MG tablet Take 325 mg by mouth daily.   Yes Historical Provider, MD  atorvastatin (LIPITOR) 20 MG tablet Take 20 mg by mouth daily.   Yes Historical Provider, MD  felodipine (PLENDIL) 10 MG 24 hr tablet Take 10 mg by mouth daily.   Yes Historical Provider, MD  lisinopril-hydrochlorothiazide (PRINZIDE,ZESTORETIC) 20-25 MG per tablet Take 1 tablet by mouth daily.   Yes Historical Provider, MD   metoprolol succinate (TOPROL-XL) 25 MG 24 hr tablet Take 25 mg by mouth daily.   Yes Historical Provider, MD  meloxicam (MOBIC) 15 MG tablet Take 1 tablet (15 mg total) by mouth daily. Patient not taking: Reported on 11/15/2014 09/04/13   Hayden Rasmussen, NP  traMADol (ULTRAM) 50 MG tablet Take 1 tablet (50 mg total) by mouth every 6 (six) hours as needed. Patient not taking: Reported on 11/15/2014 09/04/13   Hayden Rasmussen, NP    ALLERGIES:  No Known Allergies  SOCIAL HISTORY:  History  Substance Use Topics  . Smoking status: Former Games developer  . Smokeless tobacco: Not on file  . Alcohol Use: No    FAMILY HISTORY: History reviewed. No pertinent family history.  EXAM: BP 153/77 mmHg  Pulse 96  Temp(Src) 97.9 F (36.6 C) (Oral)  Resp 19  SpO2 99% CONSTITUTIONAL: Alert and oriented and responds appropriately to questions. Well-appearing; well-nourished HEAD: Normocephalic EYES: Conjunctivae clear, PERRL ENT: normal nose; no rhinorrhea; moist mucous membranes; pharynx without lesions noted NECK: Supple, no meningismus, no LAD  CARD: RRR; S1 and S2 appreciated; no murmurs, no clicks, no rubs, no gallops RESP: Normal chest excursion without splinting or tachypnea; breath sounds clear and equal bilaterally; no wheezes, no rhonchi, no rales, no hypoxia or respiratory distress, speaking full sentences, anterior chest wall nontender to palpation without crepitus or ecchymosis or deformity ABD/GI: Normal bowel sounds; non-distended; soft, non-tender, no rebound, no guarding, no peritoneal signs BACK:  The back appears normal and is non-tender  to palpation, there is no CVA tenderness EXT: Normal ROM in all joints; non-tender to palpation; no edema; normal capillary refill; no cyanosis, no calf tenderness or swelling    SKIN: Normal color for age and race; warm NEURO: Moves all extremities equally, sensation to light touch intact diffusely, cranial nerves II through XII intact PSYCH: The patient's mood  and manner are appropriate. Grooming and personal hygiene are appropriate.  MEDICAL DECISION MAKING: Patient here with atypical chest pain. EKG shows no new ischemic changes. Does have a history however of CAD with stent but states this pain feels very different. No history of PE or DVT or risk factors for the same other than age. We'll obtain cardiac labs, chest x-ray, d-dimer. Will give Toradol and reassess.  Suspect that this is musculoskeletal in nature.  ED PROGRESS: Patient's labs unremarkable other than mild leukocytosis with left shift. Chest x-ray is clear. Lungs clear. No fever. Reports his cough has been improving. I do not feel he needs [facility] Troponin 2 is negative. D-dimer negative. Pain completely resolved after Toradol. I feel he is safe to be discharged home. We'll discharge with ibuprofen to take as needed. Discussed return precautions. He verbalizes understanding and is comfortable with plan.     EKG Interpretation  Date/Time:  Thursday Nov 15 2014 18:04:16 EDT Ventricular Rate:  93 PR Interval:  174 QRS Duration: 95 QT Interval:  338 QTC Calculation: 420 R Axis:   -5 Text Interpretation:  Sinus rhythm Low voltage, precordial leads Anteroseptal infarct, age indeterminate No significant change since last tracing Confirmed by KNAPP  MD-J, JON [phone]) on 11/15/2014 6:13:56 PM         Layla MawKristen N Kadra Kohan, DO 11/16/14 0000

## 2014-11-15 NOTE — ED Notes (Signed)
Pt c/o central chest pain onset yesterday, worse with inspiration. Denies SOB. Pt has Hx of stent placed.

## 2014-11-16 MED ORDER — IBUPROFEN 800 MG PO TABS: 800.0000 mg | ORAL_TABLET | Freq: Three times a day (TID) | ORAL | Status: DC | PRN

## 2014-11-16 NOTE — ED Notes (Signed)
Pt ambulating independently w/ steady gait on d/c in no acute distress, A&Ox4. Rx given x1  

## 2015-03-05 ENCOUNTER — Encounter: Payer: Self-pay | Admitting: Cardiology

## 2015-12-09 ENCOUNTER — Encounter: Payer: Self-pay | Admitting: Gastroenterology

## 2016-01-14 ENCOUNTER — Other Ambulatory Visit: Payer: Self-pay | Admitting: Internal Medicine

## 2016-01-14 ENCOUNTER — Ambulatory Visit
Admission: RE | Admit: 2016-01-14 | Discharge: 2016-01-14 | Disposition: A | Discharge status: Home / Self Care | Payer: Medicare HMO | Source: Ambulatory Visit | Attending: Internal Medicine | Admitting: Internal Medicine

## 2016-01-14 DIAGNOSIS — R0781 Pleurodynia: Secondary | ICD-10-CM

## 2016-01-20 ENCOUNTER — Encounter: Payer: Medicare HMO | Admitting: Gastroenterology

## 2016-03-12 ENCOUNTER — Ambulatory Visit: Payer: Medicare HMO | Admitting: Gastroenterology

## 2016-03-12 ENCOUNTER — Encounter: Payer: Self-pay | Admitting: Gastroenterology

## 2016-05-28 ENCOUNTER — Ambulatory Visit (INDEPENDENT_AMBULATORY_CARE_PROVIDER_SITE_OTHER): Payer: Medicare HMO | Admitting: Gastroenterology

## 2016-05-28 ENCOUNTER — Encounter: Payer: Self-pay | Admitting: Gastroenterology

## 2016-05-28 VITALS — BP 154/74 | HR 80 | Ht 69.0 in | Wt 261.5 lb

## 2016-05-28 DIAGNOSIS — Z1211 Encounter for screening for malignant neoplasm of colon: Secondary | ICD-10-CM | POA: Diagnosis not present

## 2016-05-28 DIAGNOSIS — B1920 Unspecified viral hepatitis C without hepatic coma: Secondary | ICD-10-CM | POA: Diagnosis not present

## 2016-05-28 NOTE — Patient Instructions (Addendum)
We have sent your demographic and insurance information to [facility]. They should contact you within the next week regarding your Cologuard (colon cancer screening) test. If you have not heard from them within the next week, please call our office at [phone].  Go to the basement for labs today

## 2016-06-01 NOTE — Progress Notes (Signed)
Christian ChanceLacy P Evans    [id number]    09/26/1950  Primary Care Physician:ROBERTS, Vernie AmmonsONALD WAYNE, MD  Referring Physician: Burton Apleyonald Roberts, MD [address] [place], [place] [id number]  Chief complaint:  Screening for colorectal cancer  HPI: [age] yr African American Male here to discuss options for colorectal cancer screening. He never had a colonoscopy. Denies any GI specific complaints. Patient is afraid of undergoing anesthesia as he had a reaction when he had anesthesia about 40 years ago when he had a fall with trauma and complex foot and limb fracture. 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 No family history of colon cancer. He did receive multiple blood transfusions 40 years ago and is concerned about possible hepatitis C infection. Denies any nausea, vomiting, abdominal pain, melena or bright red blood per rectum   Outpatient Encounter Prescriptions as of 05/28/2016  Medication Sig  . allopurinol (ZYLOPRIM) 300 MG tablet Take 300 mg by mouth daily.  Marland Kitchen. aspirin 325 MG tablet Take 325 mg by mouth daily.  Marland Kitchen. atorvastatin (LIPITOR) 20 MG tablet Take 20 mg by mouth daily.  . felodipine (PLENDIL) 10 MG 24 hr tablet Take 10 mg by mouth daily.  Marland Kitchen. ibuprofen (ADVIL,MOTRIN) 800 MG tablet Take 1 tablet (800 mg total) by mouth every 8 (eight) hours as needed for mild pain.  Marland Kitchen. lisinopril-hydrochlorothiazide (PRINZIDE,ZESTORETIC) 20-25 MG per tablet Take 1 tablet by mouth daily.  . meloxicam (MOBIC) 15 MG tablet Take 1 tablet (15 mg total) by mouth daily.  . metoprolol succinate (TOPROL-XL) 25 MG 24 hr tablet Take 25 mg by mouth daily.  . traMADol (ULTRAM) 50 MG tablet Take 1 tablet (50 mg total) by mouth every 6 (six) hours as needed.   No facility-administered encounter medications on file as of 05/28/2016.     Allergies as of 05/28/2016  . (No Known Allergies)    Past Medical History:  Diagnosis  Date  . Chronic edema    lower Extremity  . Coronary artery disease   . GERD (gastroesophageal reflux disease)   . Gout   . Hx of angina pectoris   . Hypertension   . Obesity     Past Surgical History:  Procedure Laterality Date  . ARM SURGERY    . CORONARY STENT PLACEMENT    . CYST REMOVAL TRUNK    . FOOT SURGERY      Family History  Problem Relation Age of Onset  . Heart attack Mother [id number]  . Heart attack Father 3752  . Hypertension Brother   . Hypertension Sister   . Colon cancer Neg Hx   . Stomach cancer Neg Hx   . Rectal cancer Neg Hx   . Esophageal cancer Neg Hx   . Liver cancer Neg Hx     Social History   Social History  . Marital status: Married    Spouse name: N/A  . Number of children: 0  . Years of education: N/A   Occupational History  . disabled    Social History Main Topics  . Smoking status: Former Games developermoker  . Smokeless tobacco: Never Used  . Alcohol use Yes     Comment: occ  . Drug use: No  . Sexual activity: Not on file   Other Topics Concern  . Not on file   Social History Narrative  . No  narrative on file      Review of systems: Review of Systems  Constitutional: Negative for fever and chills.  HENT: Negative.   Eyes: Negative for blurred vision.  Respiratory: Negative for cough, shortness of breath and wheezing.   Cardiovascular: Negative for chest pain and palpitations.  Gastrointestinal: as per HPI Genitourinary: Negative for dysuria, urgency, frequency and hematuria.  Musculoskeletal: Negative for myalgias, back pain and joint pain.  Skin: Negative for itching and rash.  Neurological: Negative for dizziness, tremors, focal weakness, seizures and loss of consciousness.  Endo/Heme/Allergies: Positive for seasonal allergies.  Psychiatric/Behavioral: Negative for depression, suicidal ideas and hallucinations.  All other systems reviewed and are negative.   Physical Exam: Vitals:   05/28/16 1025  BP: (!) 154/74  Pulse: 80    Body mass index is 38.62 kg/m. Gen:      No acute distress HEENT:  EOMI, sclera anicteric Neck:     No masses; no thyromegaly Lungs:    Clear to auscultation bilaterally; normal respiratory effort CV:         Regular rate and rhythm; no murmurs Abd:      + bowel sounds; soft, non-tender; no palpable masses, no distension Ext:    No edema; adequate peripheral perfusion. Left foot deformity Skin:      Warm and dry; no rash Neuro: alert and oriented x 3 Psych: normal mood and affect  Data Reviewed:  Reviewed labs, radiology imaging, old records and pertinent past GI work up   Assessment and Plan/Recommendations:  65 year old male here to discuss colorectal cancer screening Patient does not think he can undergo colonoscopy and is interested in doing alternator testing He is low risk for colorectal cancer and  currently has no specific GI symptoms We'll have patient do fecal immunochemical testing, Cologaurd for colorectal cancer screening If negative we'll not need to repeat for 3 additional years  He received multiple blood transfusions in the past and is concerned about hepatitis C infection We'll check hepatitis B, A & C status with reflex hep C RNA Return as needed   Iona BeardK. Veena Thais Silberstein , MD [phone] Mon-Fri 8a-5p [phone] after 5p, weekends, holidays  CC: Burton Apleyoberts, Ronald, MD

## 2016-07-31 ENCOUNTER — Ambulatory Visit (INDEPENDENT_AMBULATORY_CARE_PROVIDER_SITE_OTHER): Payer: Medicare HMO | Admitting: Gastroenterology

## 2016-07-31 ENCOUNTER — Encounter: Payer: Self-pay | Admitting: Gastroenterology

## 2016-07-31 ENCOUNTER — Encounter (INDEPENDENT_AMBULATORY_CARE_PROVIDER_SITE_OTHER): Payer: Self-pay

## 2016-07-31 VITALS — BP 124/74 | HR 78 | Ht 69.0 in | Wt 271.6 lb

## 2016-07-31 DIAGNOSIS — Z1211 Encounter for screening for malignant neoplasm of colon: Secondary | ICD-10-CM

## 2016-07-31 DIAGNOSIS — Z1212 Encounter for screening for malignant neoplasm of rectum: Secondary | ICD-10-CM | POA: Diagnosis not present

## 2016-07-31 NOTE — Progress Notes (Signed)
Christian Evans    [id number]    05-11-1951  Primary Care Physician:ROBERTS, Sharol Given, MD  Referring Physician: Lorene Dy, MD [address], [place], [place] [address]  Chief complaint:  Colorectal cancer screening  HPI:  89 yr M here for follow up visit, was last seen in December 2017. He did not do the Cologaurd , patient says he moved and has misplaced the kit, requesting to have it remailed to his house. He didn't do labs for Hep C. He still is reluctant to undergo colonscopy. Denies any nausea, vomiting, abdominal pain, melena or bright red blood per rectum    Outpatient Encounter Prescriptions as of 07/31/2016  Medication Sig  . allopurinol (ZYLOPRIM) 300 MG tablet Take 300 mg by mouth daily.  Marland Kitchen aspirin 325 MG tablet Take 325 mg by mouth daily.  Marland Kitchen atorvastatin (LIPITOR) 20 MG tablet Take 20 mg by mouth daily.  . felodipine (PLENDIL) 10 MG 24 hr tablet Take 10 mg by mouth daily.  Marland Kitchen lisinopril-hydrochlorothiazide (PRINZIDE,ZESTORETIC) 20-25 MG per tablet Take 1 tablet by mouth daily.  . meloxicam (MOBIC) 15 MG tablet Take 1 tablet (15 mg total) by mouth daily.  . metoprolol succinate (TOPROL-XL) 25 MG 24 hr tablet Take 25 mg by mouth daily.  . traMADol (ULTRAM) 50 MG tablet Take 1 tablet (50 mg total) by mouth every 6 (six) hours as needed.  . [DISCONTINUED] ibuprofen (ADVIL,MOTRIN) 800 MG tablet Take 1 tablet (800 mg total) by mouth every 8 (eight) hours as needed for mild pain. (Patient not taking: Reported on 07/31/2016)   No facility-administered encounter medications on file as of 07/31/2016.     Allergies as of 07/31/2016  . (No Known Allergies)    Past Medical History:  Diagnosis Date  . Chronic edema    lower Extremity  . Coronary artery disease   . GERD (gastroesophageal reflux disease)   . Gout   . Hx of angina pectoris   . Hypertension   . Obesity     Past Surgical History:  Procedure Laterality Date  . ARM SURGERY    . CORONARY  STENT PLACEMENT    . CYST REMOVAL TRUNK    . FOOT SURGERY      Family History  Problem Relation Age of Onset  . Heart attack Mother 16  . Heart attack Father 45  . Hypertension Brother   . Hypertension Sister   . Colon cancer Neg Hx   . Stomach cancer Neg Hx   . Rectal cancer Neg Hx   . Esophageal cancer Neg Hx   . Liver cancer Neg Hx     Social History   Social History  . Marital status: Married    Spouse name: N/A  . Number of children: 0  . Years of education: N/A   Occupational History  . disabled    Social History Main Topics  . Smoking status: Former Research scientist (life sciences)  . Smokeless tobacco: Never Used  . Alcohol use Yes     Comment: occ  . Drug use: No  . Sexual activity: Not on file   Other Topics Concern  . Not on file   Social History Narrative  . No narrative on file      Review of systems: Review of Systems  Constitutional: Negative for fever and chills.  HENT: Negative.   Eyes: Negative for blurred vision.  Respiratory: Negative for cough, shortness of breath and wheezing.   Cardiovascular:  Negative for chest pain and palpitations.  Gastrointestinal: as per HPI Genitourinary: Negative for dysuria, urgency, frequency and hematuria.  Musculoskeletal: Negative for myalgias, back pain and joint pain.  Skin: Negative for itching and rash.  Neurological: Negative for dizziness, tremors, focal weakness, seizures and loss of consciousness.  Endo/Heme/Allergies: Positive for seasonal allergies.  Psychiatric/Behavioral: Negative for depression, suicidal ideas and hallucinations.  All other systems reviewed and are negative.   Physical Exam: Vitals:   07/31/16 1426  BP: 124/74  Pulse: 78   Body mass index is 40.11 kg/m. Gen:      No acute distress HEENT:  EOMI, sclera anicteric Neck:     No masses; no thyromegaly Lungs:    Clear to auscultation bilaterally; normal respiratory effort CV:         Regular rate and rhythm; no murmurs Abd:      + bowel  sounds; soft, non-tender; no palpable masses, no distension Ext:    No edema; adequate peripheral perfusion Skin:      Warm and dry; no rash Neuro: alert and oriented x 3 Psych: normal mood and affect  Data Reviewed:  Reviewed labs, radiology imaging, old records and pertinent past GI work up   Assessment and Plan/Recommendations:  86 yr M here to discuss colorectal Ca screening. Patient is reluctant to undergo colonoscopy and he didn't do Cologaurd that was requested at last visit about 2 months ago, he misplaced it We re order Cologaurd Discussed with patient that if its positive, he will need colonoscopy for further evaluation and if negative will repeat in 3 years.   15 minutes was spent face-to-face with the patient. Greater than 50% of the time used for counseling as well as treatment plan and follow-up.  [name] , MD [phone] Mon-Fri 8a-5p [phone] after 5p, weekends, holidays  CC: Lorene Dy, MD

## 2016-07-31 NOTE — Patient Instructions (Signed)
We have sent your demographic and insurance information to [facility]. They should contact you within the next week regarding your Cologuard (colon cancer screening) test. If you have not heard from them within the next week, please call our office at [phone]. 

## 2016-08-15 IMAGING — CR DG CHEST 2V
2 series · 2 of 2 positions shown · non-contrast
Comparison: 05/04/2012

CLINICAL DATA: Mid chest pain since yesterday. Productive cough for
while. History of hypertension and smoking.

EXAM:
CHEST  2 VIEW

[w chest pa]
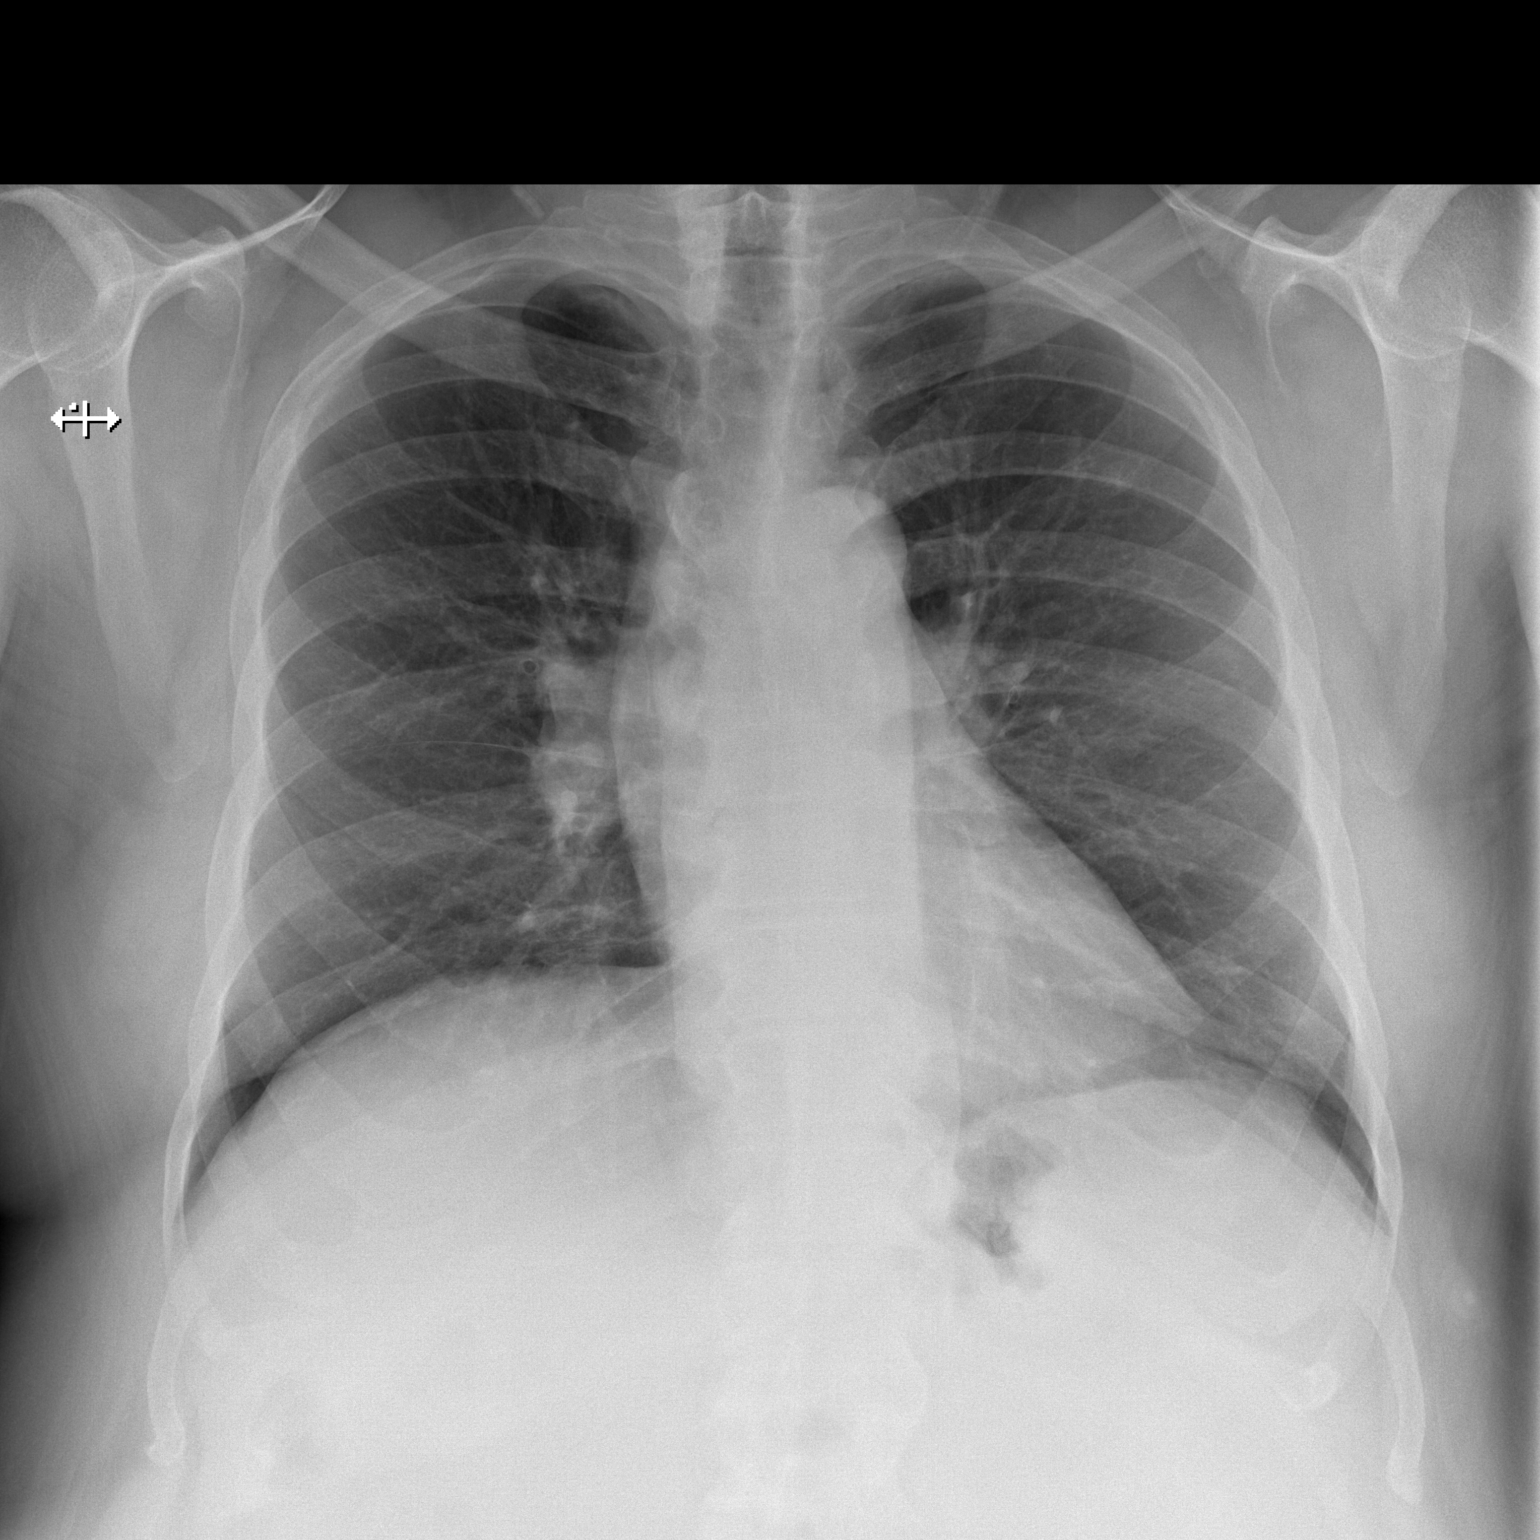

[w chest lat]
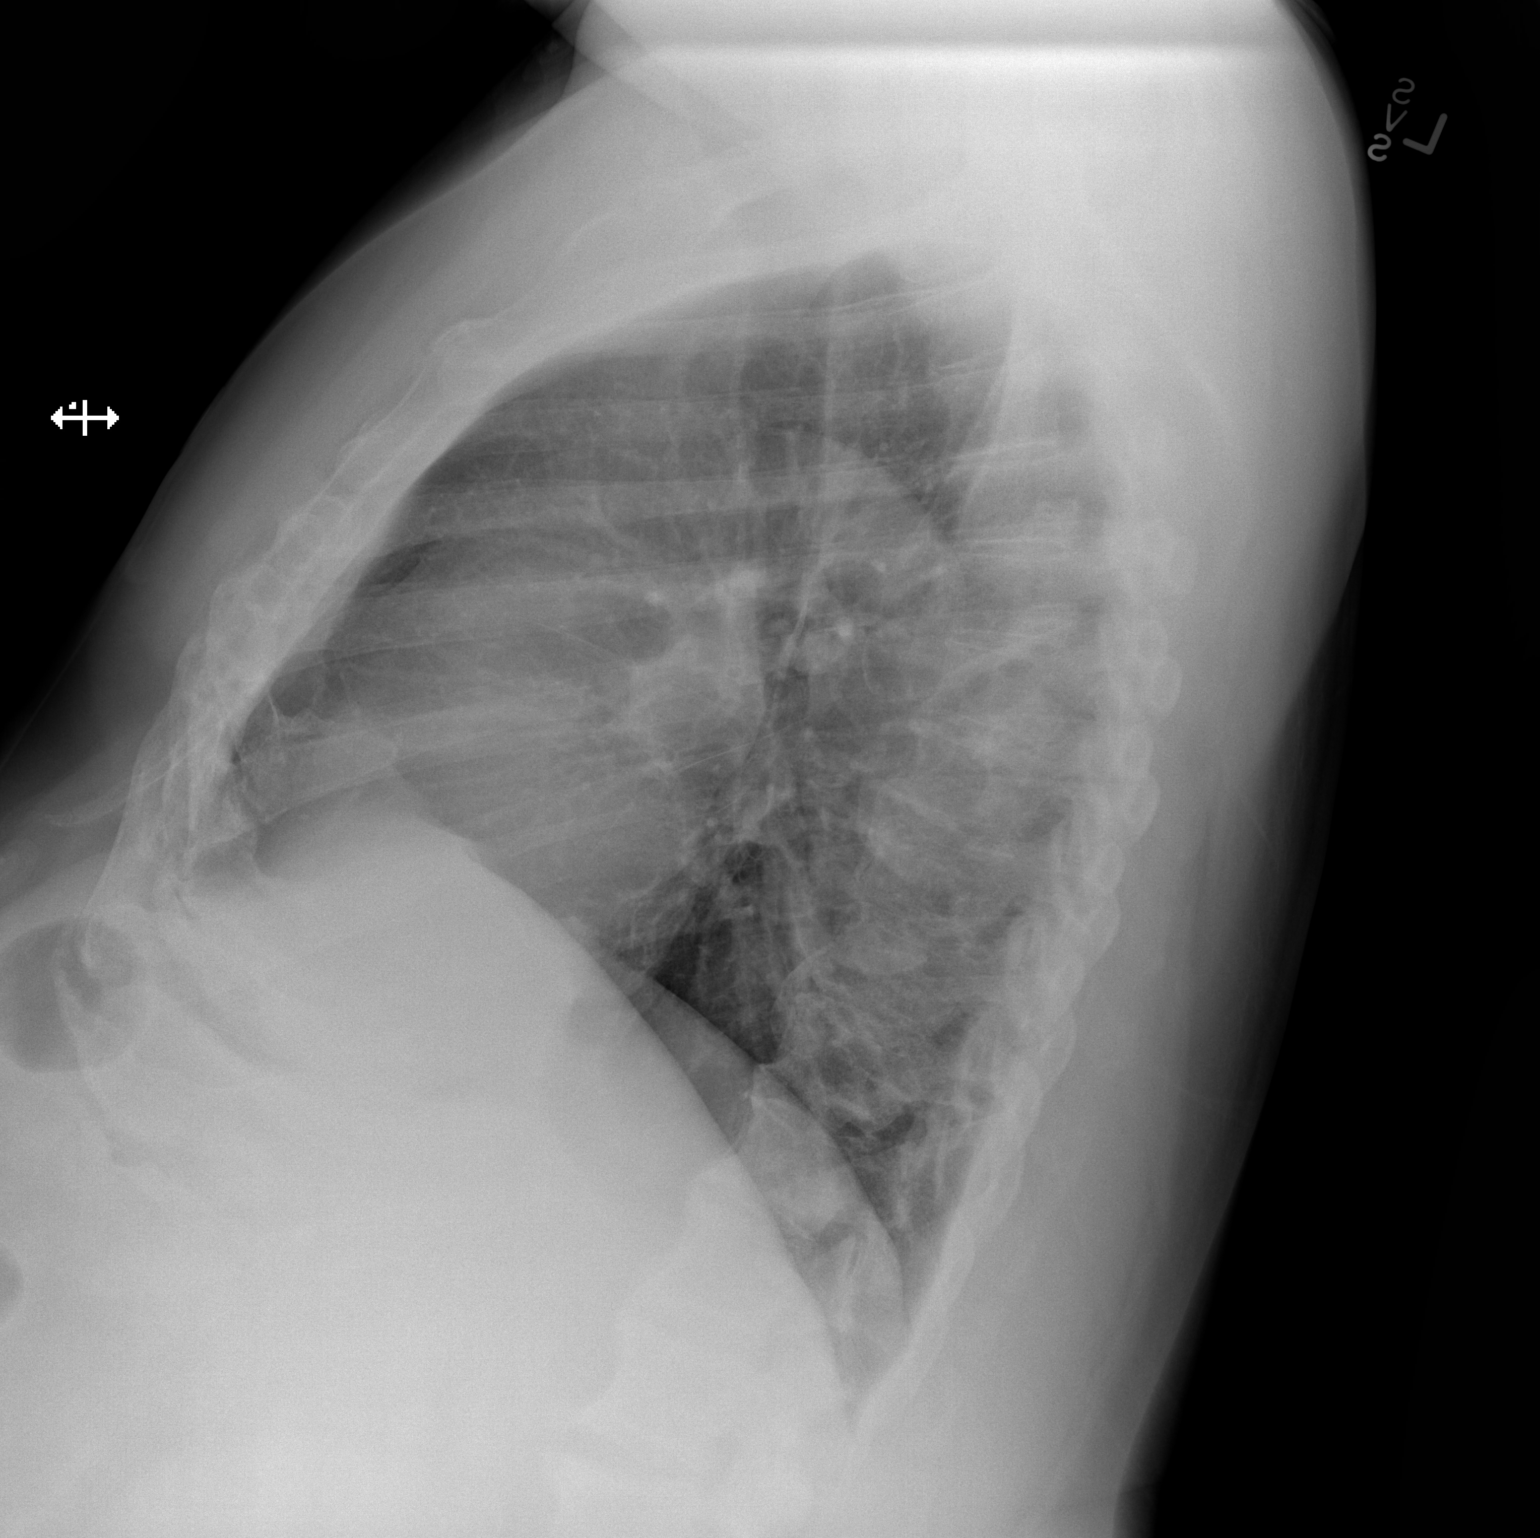

[2 of 2 positions shown; findings below may reference images not displayed]

FINDINGS: The heart size and mediastinal contours are within normal limits.
Both lungs are clear. Mid thoracic spondylosis.
IMPRESSION: No active cardiopulmonary disease.

## 2016-10-01 ENCOUNTER — Telehealth: Payer: Self-pay

## 2016-10-01 NOTE — Telephone Encounter (Signed)
Cologuard ordered 05-2016 has been canceled due to inactivity.

## 2016-10-01 NOTE — Telephone Encounter (Signed)
Please call patient and check why he didn't do it as this is the 2nd time we requested the test

## 2016-10-01 NOTE — Telephone Encounter (Signed)
Called patient and he stated he has not seen cologard   [employer] cologard orders are good for 1 year.  Since patient no longer has the prevoius test ordered they will ship him out a new test kit...   Called pt to inform to keep an eye out over the next 7 days for kit

## 2019-08-24 ENCOUNTER — Ambulatory Visit: Payer: Medicare HMO

## 2019-08-27 ENCOUNTER — Ambulatory Visit: Payer: Medicare HMO | Attending: Internal Medicine

## 2019-08-27 DIAGNOSIS — Z23 Encounter for immunization: Secondary | ICD-10-CM | POA: Insufficient documentation

## 2019-08-27 NOTE — Progress Notes (Signed)
   Covid-19 Vaccination [facility]  Name:  GIORGI DEBRUIN    MRN: [id number] DOB: Jan 09, 1951  08/27/2019  Mr. Pruiett was observed post Covid-19 immunization for 15 minutes without incident. He was provided with Vaccine Information Sheet and instruction to access the V-Safe system.   Mr. Bastin was instructed to call 911 with any severe reactions post vaccine: Marland Kitchen Difficulty breathing  . Swelling of face and throat  . A fast heartbeat  . A bad rash all over body  . Dizziness and weakness   Immunizations Administered    Name Date Dose VIS Date Route   Pfizer COVID-19 Vaccine 08/27/2019 11:09 AM 0.3 mL 06/02/2019 Intramuscular   Manufacturer: [employer], [employer]   Lot: [id number]   NDC: [address]-0

## 2019-09-26 ENCOUNTER — Ambulatory Visit: Payer: Medicare HMO | Attending: Internal Medicine

## 2019-09-26 DIAGNOSIS — Z23 Encounter for immunization: Secondary | ICD-10-CM

## 2019-09-26 NOTE — Progress Notes (Signed)
   Covid-19 Vaccination Clinic  Name:  Christian Evans    MRN: [id number] DOB: 1951/06/21  09/26/2019  Mr. Christian Evans was observed post Covid-19 immunization for 15 minutes without incident. He was provided with Vaccine Information Sheet and instruction to access the V-Safe system.   Mr. Christian Evans was instructed to call 911 with any severe reactions post vaccine: Marland Kitchen Difficulty breathing  . Swelling of face and throat  . A fast heartbeat  . A bad rash all over body  . Dizziness and weakness   Immunizations Administered    Name Date Dose VIS Date Route   Pfizer COVID-19 Vaccine 09/26/2019 11:22 AM 0.3 mL 06/02/2019 Intramuscular   Manufacturer: [employer], [employer]   Lot: [id number]   NDC: [address]-0

## 2022-09-18 ENCOUNTER — Ambulatory Visit
Admission: RE | Admit: 2022-09-18 | Discharge: 2022-09-18 | Disposition: A | Discharge status: Home / Self Care | Payer: Medicare Other | Source: Ambulatory Visit | Attending: Internal Medicine | Admitting: Internal Medicine

## 2022-09-18 ENCOUNTER — Other Ambulatory Visit: Payer: Self-pay | Admitting: Internal Medicine

## 2022-09-18 DIAGNOSIS — R059 Cough, unspecified: Secondary | ICD-10-CM

## 2023-04-12 ENCOUNTER — Ambulatory Visit (HOSPITAL_COMMUNITY)
Admission: EM | Admit: 2023-04-12 | Discharge: 2023-04-12 | Disposition: A | Discharge status: Home / Self Care | Payer: Medicare Other | Attending: Family Medicine | Admitting: Family Medicine

## 2023-04-12 ENCOUNTER — Encounter (HOSPITAL_COMMUNITY): Payer: Self-pay | Admitting: Emergency Medicine

## 2023-04-12 ENCOUNTER — Other Ambulatory Visit: Payer: Self-pay

## 2023-04-12 DIAGNOSIS — S0993XA Unspecified injury of face, initial encounter: Secondary | ICD-10-CM | POA: Diagnosis not present

## 2023-04-12 DIAGNOSIS — M549 Dorsalgia, unspecified: Secondary | ICD-10-CM

## 2023-04-12 DIAGNOSIS — W19XXXA Unspecified fall, initial encounter: Secondary | ICD-10-CM | POA: Diagnosis not present

## 2023-04-12 DIAGNOSIS — I10 Essential (primary) hypertension: Secondary | ICD-10-CM

## 2023-04-12 MED ORDER — HYDROCODONE-ACETAMINOPHEN 5-325 MG PO TABS: 1.0000 | ORAL_TABLET | Freq: Every evening | ORAL | 0 refills | Status: DC | PRN

## 2023-04-12 MED ORDER — MELOXICAM 15 MG PO TABS: 15.0000 mg | ORAL_TABLET | Freq: Every day | ORAL | 0 refills | Status: DC

## 2023-04-12 NOTE — Discharge Instructions (Addendum)
Be aware, you have been prescribed pain medications that may cause drowsiness. While taking this medication, do not take any other medications containing acetaminophen (Tylenol). Do not combine with alcohol or recreational drugs. Please do not drive, operate heavy machinery, or take part in activities that require making important decisions while on this medication as your judgement may be clouded.  Your blood pressure was noted to be elevated during your visit today. If you are currently taking medication for high blood pressure, please ensure you are taking this as directed. If you do not have a history of high blood pressure and your blood pressure remains persistently elevated, you may need to begin taking a medication at some point. You may return here within the next few days to recheck if unable to see your primary care provider or if you do not have a one.  BP (!) 188/80 (BP Location: Left Arm)   Pulse 81   Temp 97.6 F (36.4 C) (Oral)   Resp 20   SpO2 95%   BP Readings from Last 3 Encounters:  04/12/23 (!) 188/80  07/31/16 124/74  05/28/16 (!) 154/74

## 2023-04-12 NOTE — ED Triage Notes (Signed)
Patient was walking down the driveway, foot slipped on a piece of wood and fell on his face.  Driveway is concrete.  No loc.  Pain across both shoulders.  Abrasion to right side of face, upper eyelid, cheek and above right eyebrow and nose.  Patient was not wearing glasses when this happened.  Right eye lids are swollen.  Denies any vision changes.    Unsure when last tetanus shot was.

## 2023-04-14 NOTE — ED Provider Notes (Addendum)
[facility]   [id number] 04/12/23 Arrival Time: 1125  ASSESSMENT & PLAN:  1. Acute upper back pain   2. Fall, initial encounter   3. Facial injury, initial encounter   4. Elevated blood pressure reading with diagnosis of hypertension    Pain of upper back appears completely musculature; without cervical midline TTP; moves neck normally. Denies ED visit for facial trauma. Discussed that I cannot r/o orbital fractures here.  Meds ordered this encounter  Medications   meloxicam (MOBIC) 15 MG tablet    Sig: Take 1 tablet (15 mg total) by mouth daily.    Dispense:  20 tablet    Refill:  0   HYDROcodone-acetaminophen (NORCO/VICODIN) 5-325 MG tablet    Sig: Take 1 tablet by mouth at bedtime as needed for severe pain (pain score 7-10).    Dispense:  6 tablet    Refill:  0   [place] Controlled Substances Registry consulted for this patient. I feel the risk/benefit ratio today is favorable for proceeding with this prescription for a controlled substance. Medication sedation precautions given.   Follow-up Information     Burton Apley, MD.   Specialty: Internal Medicine Why: If worsening or failing to improve as anticipated. Contact information: [address] [place] [address] [phone]                Will also f/u with PCP to address BP.  Reviewed expectations re: course of current medical issues. Questions answered. Outlined signs and symptoms indicating need for more acute intervention. Patient verbalized understanding. After Visit Summary given.  SUBJECTIVE: History from: patient. Christian Evans is a 72 y.o. male who was walking down the driveway, foot slipped on a piece of wood and fell on his face.  Driveway is concrete.  No loc.  Pain across both shoulders.  Abrasion to right side of face, upper eyelid, cheek and above right eyebrow and nose.  Patient was not wearing glasses when this happened.  Right eye lids are swollen.  Denies any vision  changes/double vision. Upper back pain bothering him the most. No extremity sensation changes or weakness.   OBJECTIVE:  Vitals:   04/12/23 1330  BP: (!) 188/80  Pulse: 81  Resp: 20  Temp: 97.6 F (36.4 C)  TempSrc: Oral  SpO2: 95%     GCS: 15 General appearance: alert; no distress HEENT: normocephalic; R-sided facial abrasions with swelling around R orbit; does report mild TTP around R orbit; PERRLA; EOMI without pain; conjunctivae normal; no orbital bruising or tenderness to palpation; TMs normal; no bleeding from ears; oral mucosa normal Neck: supple with FROM but moves slowly; no midline tenderness; does have tenderness of cervical musculature extending over trapezius distribution bilaterally Lungs: clear to auscultation bilaterally; unlabored Heart: regular rate and rhythm Chest wall: without tenderness to palpation; without bruising Abdomen: soft, non-tender; no bruising Back: no midline tenderness; without tenderness to palpation of lumbar paraspinal musculature Extremities: moves all extremities normally; no edema; symmetrical with no gross deformities Skin: warm and dry;  Neurologic: gait normal; normal sensation and strength of all extremities Psychological: alert and cooperative; normal mood and affect     Allergies  Allergen Reactions   Lisinopril    Past Medical History:  Diagnosis Date   Chronic edema    lower Extremity   Coronary artery disease    GERD (gastroesophageal reflux disease)    Gout    Hx of angina pectoris    Hypertension    Obesity  Past Surgical History:  Procedure Laterality Date   ARM SURGERY     CORONARY STENT PLACEMENT     CYST REMOVAL TRUNK     FOOT SURGERY     Family History  Problem Relation Age of Onset   Heart attack Mother 19   Heart attack Father 28   Hypertension Brother    Hypertension Sister    Colon cancer Neg Hx    Stomach cancer Neg Hx    Rectal cancer Neg Hx    Esophageal cancer Neg Hx    Liver cancer  Neg Hx    Social History   Socioeconomic History   Marital status: Married    Spouse name: Not on file   Number of children: 0   Years of education: Not on file   Highest education level: Not on file  Occupational History   Occupation: disabled  Tobacco Use   Smoking status: Former   Smokeless tobacco: Never  Advertising account planner   Vaping status: Never Used  Substance and Sexual Activity   Alcohol use: Yes    Comment: occ   Drug use: No   Sexual activity: Not on file  Other Topics Concern   Not on file  Social History Narrative   Not on file   Social Determinants of Health   Financial Resource Strain: Not on file  Food Insecurity: Not on file  Transportation Needs: Not on file  Physical Activity: Not on file  Stress: Not on file  Social Connections: Not on file           [place], MD 04/14/23 0981    Mardella Layman, MD 04/14/23 [phone]

## 2023-04-20 ENCOUNTER — Ambulatory Visit: Payer: Medicare Other | Attending: Internal Medicine | Admitting: Internal Medicine

## 2023-06-06 ENCOUNTER — Other Ambulatory Visit: Payer: Self-pay

## 2023-06-06 ENCOUNTER — Emergency Department (HOSPITAL_COMMUNITY)
Admission: EM | Admit: 2023-06-06 | Discharge: 2023-06-06 | Disposition: A | Discharge status: Home / Self Care | Payer: Medicare Other | Attending: Emergency Medicine | Admitting: Emergency Medicine

## 2023-06-06 ENCOUNTER — Emergency Department (HOSPITAL_COMMUNITY): Payer: Medicare Other

## 2023-06-06 DIAGNOSIS — J181 Lobar pneumonia, unspecified organism: Secondary | ICD-10-CM | POA: Diagnosis not present

## 2023-06-06 DIAGNOSIS — J189 Pneumonia, unspecified organism: Secondary | ICD-10-CM

## 2023-06-06 DIAGNOSIS — Z79899 Other long term (current) drug therapy: Secondary | ICD-10-CM | POA: Insufficient documentation

## 2023-06-06 DIAGNOSIS — Z20822 Contact with and (suspected) exposure to covid-19: Secondary | ICD-10-CM | POA: Insufficient documentation

## 2023-06-06 DIAGNOSIS — E876 Hypokalemia: Secondary | ICD-10-CM | POA: Insufficient documentation

## 2023-06-06 DIAGNOSIS — I10 Essential (primary) hypertension: Secondary | ICD-10-CM | POA: Insufficient documentation

## 2023-06-06 DIAGNOSIS — Z7982 Long term (current) use of aspirin: Secondary | ICD-10-CM | POA: Diagnosis not present

## 2023-06-06 DIAGNOSIS — R059 Cough, unspecified: Secondary | ICD-10-CM | POA: Diagnosis present

## 2023-06-06 LAB — CBC WITH DIFFERENTIAL/PLATELET
Abs Immature Granulocytes: 0.05 10*3/uL (ref 0.00–0.07)
Basophils Absolute: 0 10*3/uL (ref 0.0–0.1)
Basophils Relative: 0 %
Eosinophils Absolute: 0.2 10*3/uL (ref 0.0–0.5)
Eosinophils Relative: 2 %
HCT: 41.9 % (ref 39.0–52.0)
Hemoglobin: 13.6 g/dL (ref 13.0–17.0)
Immature Granulocytes: 1 %
Lymphocytes Relative: 19 %
Lymphs Abs: 1.9 10*3/uL (ref 0.7–4.0)
MCH: 28.3 pg (ref 26.0–34.0)
MCHC: 32.5 g/dL (ref 30.0–36.0)
MCV: 87.3 fL (ref 80.0–100.0)
Monocytes Absolute: 0.6 10*3/uL (ref 0.1–1.0)
Monocytes Relative: 6 %
Neutro Abs: 7.6 10*3/uL (ref 1.7–7.7)
Neutrophils Relative %: 72 %
Platelets: 327 10*3/uL (ref 150–400)
RBC: 4.8 MIL/uL (ref 4.22–5.81)
RDW: 13.3 % (ref 11.5–15.5)
WBC: 10.4 10*3/uL (ref 4.0–10.5)
nRBC: 0 % (ref 0.0–0.2)

## 2023-06-06 LAB — RESP PANEL BY RT-PCR (RSV, FLU A&B, COVID)  RVPGX2
Influenza A by PCR: NEGATIVE
Influenza B by PCR: NEGATIVE
Resp Syncytial Virus by PCR: NEGATIVE
SARS Coronavirus 2 by RT PCR: NEGATIVE

## 2023-06-06 LAB — COMPREHENSIVE METABOLIC PANEL
ALT: 52 U/L — ABNORMAL HIGH (ref 0–44)
AST: 49 U/L — ABNORMAL HIGH (ref 15–41)
Albumin: 2.7 g/dL — ABNORMAL LOW (ref 3.5–5.0)
Alkaline Phosphatase: 77 U/L (ref 38–126)
Anion gap: 12 (ref 5–15)
BUN: 10 mg/dL (ref 8–23)
CO2: 27 mmol/L (ref 22–32)
Calcium: 8.9 mg/dL (ref 8.9–10.3)
Chloride: 99 mmol/L (ref 98–111)
Creatinine, Ser: 1.13 mg/dL (ref 0.61–1.24)
GFR, Estimated: >60 mL/min (ref >60)
Glucose, Bld: 125 mg/dL — ABNORMAL HIGH (ref 70–99)
Potassium: 3.3 mmol/L — ABNORMAL LOW (ref 3.5–5.1)
Sodium: 138 mmol/L (ref 135–145)
Total Bilirubin: 0.6 mg/dL (ref <1.2)
Total Protein: 6.6 g/dL (ref 6.5–8.1)

## 2023-06-06 MED ORDER — AMOXICILLIN-POT CLAVULANATE 875-125 MG PO TABS: 1.0000 | ORAL_TABLET | Freq: Two times a day (BID) | ORAL | 0 refills | Status: DC

## 2023-06-06 MED ORDER — AZITHROMYCIN 250 MG PO TABS: 250.0000 mg | ORAL_TABLET | Freq: Every day | ORAL | 0 refills | Status: DC

## 2023-06-06 MED ORDER — BENZONATATE 100 MG PO CAPS: 100.0000 mg | ORAL_CAPSULE | Freq: Three times a day (TID) | ORAL | 0 refills | Status: DC | PRN

## 2023-06-06 MED ORDER — SODIUM CHLORIDE 0.9 % IV SOLN
1.0000 g | Freq: Once | INTRAVENOUS | Status: AC
  Administered 2023-06-06: 1 g via INTRAVENOUS
  Filled 2023-06-06: qty 10

## 2023-06-06 MED ORDER — POTASSIUM CHLORIDE CRYS ER 20 MEQ PO TBCR
20.0000 meq | EXTENDED_RELEASE_TABLET | Freq: Once | ORAL | Status: AC
  Administered 2023-06-06: 20 meq via ORAL
  Filled 2023-06-06: qty 1

## 2023-06-06 MED ORDER — AZITHROMYCIN 250 MG PO TABS
500.0000 mg | ORAL_TABLET | Freq: Once | ORAL | Status: AC
  Administered 2023-06-06: 500 mg via ORAL
  Filled 2023-06-06: qty 2

## 2023-06-06 NOTE — ED Provider Notes (Signed)
[place] EMERGENCY DEPARTMENT AT [facility] Provider Note   CSN: [id number] Arrival date & time: 06/06/23  0425     History  Chief Complaint  Patient presents with   Cough    Christian Evans is a 72 y.o. male.  The history is provided by the patient.  Cough Christian Evans is a 72 y.o. male who presents to the Emergency Department complaining of cough.  He presents emergency department for evaluation of cough that started on Tuesday.  Is productive of clear phlegm.  He reports abdominal discomfort from coughing but no true abdominal pain.  No fever, chest pain, difficulty breathing, nausea, vomiting, diarrhea.  He has chronic bilateral lower extremity edema and states this is baseline for him.    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  He has a history of hypertension, gout.  He is compliant with his medications.  No history of DVT/PE.  He is a daily drinker of 2-3 beers.  No history of liver disease.  Has a history of hypertension, hyperlipidemia.      Home Medications Prior to Admission medications   Medication Sig Start Date End Date Taking? Authorizing Provider  amoxicillin-clavulanate (AUGMENTIN) 875-125 MG tablet Take 1 tablet by mouth every 12 (twelve) hours. 06/06/23  Yes Tilden Fossa, MD  azithromycin (ZITHROMAX) 250 MG tablet Take 1 tablet (250 mg total) by mouth daily. Take first 2 tablets together, then 1 every day until finished. 06/06/23  Yes Tilden Fossa, MD  benzonatate (TESSALON) 100 MG capsule Take 1 capsule (100 mg total) by mouth 3 (three) times daily as needed for cough. 06/06/23  Yes Tilden Fossa, MD  allopurinol (ZYLOPRIM) 300 MG tablet Take 300 mg by mouth daily.    [provider]  aspirin 325 MG tablet Take 325 mg by mouth daily.    [provider]  atorvastatin (LIPITOR) 20 MG tablet Take 20 mg by mouth daily.    [provider]  felodipine (PLENDIL) 10 MG 24 hr tablet Take 10 mg  by mouth daily.    [provider]  HYDROcodone-acetaminophen (NORCO/VICODIN) 5-325 MG tablet Take 1 tablet by mouth at bedtime as needed for severe pain (pain score 7-10). 04/12/23   Mardella Layman, MD  lisinopril-hydrochlorothiazide (PRINZIDE,ZESTORETIC) 20-25 MG per tablet Take 1 tablet by mouth daily. Patient not taking: Reported on 04/12/2023    [provider]  meloxicam (MOBIC) 15 MG tablet Take 1 tablet (15 mg total) by mouth daily. 04/12/23   Mardella Layman, MD  metoprolol succinate (TOPROL-XL) 25 MG 24 hr tablet Take 25 mg by mouth daily.    [provider]  omeprazole (PRILOSEC) 20 MG capsule Take 20 mg by mouth daily.  09/04/13  [provider]  testosterone (ANDRODERM) 2.5 MG/24HR Place 1 patch onto the skin daily.  09/04/13  [provider]      Allergies    Lisinopril    Review of Systems   Review of Systems  Respiratory:  Positive for cough.   All other systems reviewed and are negative.   Physical Exam Updated Vital Signs BP (!) 154/73   Pulse 68   Temp (!) 97.5 F (36.4 C)   Resp 19   Ht 5\' 10"  (1.778 m)   Wt 115.7 kg   SpO2 95%   BMI 36.59 kg/m  Physical Exam Vitals and nursing note reviewed.  Constitutional:      Appearance: He is well-developed.  HENT:  Head: Normocephalic and atraumatic.  Cardiovascular:     Rate and Rhythm: Normal rate and regular rhythm.     Heart sounds: No murmur heard. Pulmonary:     Effort: Pulmonary effort is normal. No respiratory distress.     Breath sounds: Normal breath sounds.  Abdominal:     Palpations: Abdomen is soft.     Tenderness: There is no abdominal tenderness. There is no guarding or rebound.  Musculoskeletal:        General: No tenderness.     Comments: Trace nonpitting to BLE  Skin:    General: Skin is warm and dry.  Neurological:     Mental Status: He is alert and oriented to person, place, and time.  Psychiatric:        Behavior: Behavior normal.      ED Results / Procedures / Treatments   Labs (all labs ordered are listed, but only abnormal results are displayed) Labs Reviewed  COMPREHENSIVE METABOLIC PANEL - Abnormal; Notable for the following components:      Result Value   Potassium 3.3 (*)    Glucose, Bld 125 (*)    Albumin 2.7 (*)    AST 49 (*)    ALT 52 (*)    All other components within normal limits  RESP PANEL BY RT-PCR (RSV, FLU A&B, COVID)  RVPGX2  CBC WITH DIFFERENTIAL/PLATELET    EKG EKG Interpretation Date/Time:  Sunday June 06 2023 05:26:52 EST Ventricular Rate:  67 PR Interval:  175 QRS Duration:  103 QT Interval:  408 QTC Calculation: 431 R Axis:   3  Text Interpretation: Sinus rhythm Anterior infarct, old Confirmed by Tilden Fossa [phone]) on 06/06/2023 5:44:48 AM  Radiology DG Chest 2 View Result Date: 06/06/2023 CLINICAL DATA:  72 year old male with history of cough. EXAM: CHEST - 2 VIEW COMPARISON:  Chest x-ray 09/18/2022. FINDINGS: Extensive airspace consolidation in the right lung base obscuring the right heart border on the frontal projection, projecting over the right middle lobe obscuring the anterior aspect of the right hemidiaphragm on the lateral projection, indicative of right middle lobe pneumonia. Left lung is clear. No pleural effusions. No pneumothorax. No evidence of pulmonary edema. Heart size is normal. Upper mediastinal contours are within normal limits. Atherosclerotic calcifications in the thoracic aorta. IMPRESSION: 1. Right middle lobe pneumonia. Followup PA and lateral chest X-ray is recommended in 3-4 weeks following trial of antibiotic therapy to ensure resolution and exclude underlying malignancy. 2. Aortic atherosclerosis. Electronically Signed   By: Trudie Reed M.D.   On: 06/06/2023 05:33    Procedures Procedures    Medications Ordered in ED Medications  potassium chloride SA (KLOR-CON M) CR tablet 20 mEq (has no administration in time range)  cefTRIAXone  (ROCEPHIN) 1 g in sodium chloride 0.9 % 100 mL IVPB (0 g Intravenous Stopped 06/06/23 0631)  azithromycin (ZITHROMAX) tablet 500 mg (500 mg Oral Given 06/06/23 0631)    ED Course/ Medical Decision Making/ A&P                                 Medical Decision Making Amount and/or Complexity of Data Reviewed Labs: ordered.  Risk Prescription drug management.   Patient with history of hypertension, hyperlipidemia here for evaluation of cough.  No chest pain or difficulty breathing.  Chest x-ray does demonstrate a right middle lobe infiltrate-images personally reviewed and interpreted, agree with radiologist interpretation.  BMP with mild hypokalemia.  CBC is unremarkable..  Current clinical picture is not consistent with PE, CHF, ACS.  Port score is risk class III, hemodynamically stable with no respiratory distress.  Feel he is low enough risk for trial of outpatient antibiotics with very close return precautions.  Discussed with patient need for repeat imaging to rule out malignancy as well as close PCP follow-up.        Final Clinical Impression(s) / ED Diagnoses Final diagnoses:  Community acquired pneumonia of right middle lobe of lung  Hypokalemia    Rx / DC Orders ED Discharge Orders          Ordered    amoxicillin-clavulanate (AUGMENTIN) 875-125 MG tablet  Every 12 hours        06/06/23 0722    azithromycin (ZITHROMAX) 250 MG tablet  Daily        06/06/23 0722    benzonatate (TESSALON) 100 MG capsule  3 times daily PRN        06/06/23 7846              Tilden Fossa, MD 06/06/23 [phone]

## 2023-06-06 NOTE — ED Triage Notes (Signed)
Patient arrives for four days of productive cough. States mucous was yellow and now clear. Taking nasal decongestant with some relief. Only other complaint is abdominal muscle soreness from coughing so much.

## 2023-06-06 NOTE — Discharge Instructions (Addendum)
You have pneumonia on your xray today.  It is recommended that you have a repeat xray performed in the next 3-4 weeks.

## 2023-06-11 ENCOUNTER — Emergency Department (HOSPITAL_COMMUNITY)
Admission: EM | Admit: 2023-06-11 | Discharge: 2023-06-11 | Disposition: A | Discharge status: Home / Self Care | Payer: Medicare Other | Source: Home / Self Care

## 2023-11-16 ENCOUNTER — Other Ambulatory Visit: Payer: Self-pay | Admitting: Urology

## 2023-12-02 NOTE — Patient Instructions (Addendum)
 SURGICAL WAITING ROOM VISITATION Patients having surgery or a procedure may have no more than 2 support people in the waiting area - these visitors may rotate in the visitor waiting room.   If the patient needs to stay at the hospital during part of their recovery, the visitor guidelines for inpatient rooms apply.  PRE-OP VISITATION  Pre-op nurse will coordinate an appropriate time for 1 support person to accompany the patient in pre-op.  This support person may not rotate.  This visitor will be contacted when the time is appropriate for the visitor to come back in the pre-op area.  Please refer to the [facility] website for the visitor guidelines for Inpatients (after your surgery is over and you are in a regular room).  You are not required to quarantine at this time prior to your surgery. However, you must do this: Hand Hygiene often Do NOT share personal items Notify your provider if you are in close contact with someone who has COVID or you develop fever 100.4 or greater, new onset of sneezing, cough, sore throat, shortness of breath or body aches.  If you test positive for Covid or have been in contact with anyone that has tested positive in the last 10 days please notify you surgeon.    Your procedure is scheduled on:  December 07, 2023  Report to [facility] Main Entrance: [address] entrance where the [employer] is available.   Report to admitting at: 12:45  PM  Call this number if you have any questions or problems the morning of surgery [phone]  DO NOT EAT OR DRINK ANYTHING AFTER MIDNIGHT THE NIGHT PRIOR TO YOUR SURGERY / PROCEDURE.   FOLLOW  ANY ADDITIONAL PRE OP INSTRUCTIONS YOU RECEIVED FROM YOUR SURGEON'S OFFICE!!!   Oral Hygiene is also important to reduce your risk of infection.        Remember - BRUSH YOUR TEETH THE MORNING OF SURGERY WITH YOUR REGULAR TOOTHPASTE  Do NOT smoke after Midnight the night before surgery.  STOP TAKING all Vitamins, Herbs and  supplements 1 week before your surgery.   ASPIRIN-  Stopped on 12-02-23  Take ONLY these medicines the morning of surgery with A SIP OF WATER: none         * takes all medications at night,  you may take them the night before surgery.                     You may not have any metal on your body including jewelry, and body piercing  Do not wear  lotions, powders, cologne, or deodorant  Men may shave face and neck.  Contacts, Hearing Aids, dentures or bridgework may not be worn into surgery. DENTURES WILL BE REMOVED PRIOR TO SURGERY PLEASE DO NOT APPLY Poly grip OR ADHESIVES!!!  Patients discharged on the day of surgery will not be allowed to drive home.  Someone NEEDS to stay with you for the first 24 hours after anesthesia.  Do not bring your home medications to the hospital. The Pharmacy will dispense medications listed on your medication list to you during your admission in the Hospital.  Special Instructions: Bring a copy of your healthcare power of attorney and living will documents the day of surgery, if you wish to have them scanned into your [facility] Records- EPIC  Please read over the following fact sheets you were given: IF YOU HAVE QUESTIONS ABOUT YOUR PRE-OP INSTRUCTIONS, PLEASE CALL [phone].   [place] -  Preparing for Surgery Before surgery, you can play an important role.  Because skin is not sterile, your skin needs to be as free of germs as possible.  You can reduce the number of germs on your skin by washing with CHG (chlorahexidine gluconate) soap before surgery.  CHG is an antiseptic cleaner which kills germs and bonds with the skin to continue killing germs even after washing. Please DO NOT use if you have an allergy to CHG or antibacterial soaps.  If your skin becomes reddened/irritated stop using the CHG and inform your nurse when you arrive at Short Stay. Do not shave (including legs and underarms) for at least 48 hours prior to the first CHG shower.   You may shave your face/neck.  Please follow these instructions carefully:  1.  Shower with CHG Soap the night before surgery and the  morning of surgery.  2.  If you choose to wash your hair, wash your hair first as usual with your normal  shampoo.  3.  After you shampoo, rinse your hair and body thoroughly to remove the shampoo.                             4.  Use CHG as you would any other liquid soap.  You can apply chg directly to the skin and wash.  Gently with a scrungie or clean washcloth.  5.  Apply the CHG Soap to your body ONLY FROM THE NECK DOWN.   Do not use on face/ open                           Wound or open sores. Avoid contact with eyes, ears mouth and genitals (private parts).                       Wash face,  Genitals (private parts) with your normal soap.             6.  Wash thoroughly, paying special attention to the area where your  surgery  will be performed.  7.  Thoroughly rinse your body with warm water from the neck down.  8.  DO NOT shower/wash with your normal soap after using and rinsing off the CHG Soap.            9.  Pat yourself dry with a clean towel.            10.  Wear clean pajamas.            11.  Place clean sheets on your bed the night of your first shower and do not  sleep with pets.  ON THE DAY OF SURGERY : Do not apply any lotions/deodorants the morning of surgery.  Please wear clean clothes to the hospital/surgery center.    FAILURE TO FOLLOW THESE INSTRUCTIONS MAY RESULT IN THE CANCELLATION OF YOUR SURGERY  PATIENT SIGNATURE_________________________________  NURSE SIGNATURE__________________________________  ________________________________________________________________________

## 2023-12-02 NOTE — Progress Notes (Addendum)
 Patient did not come to his In-Person PST appt, so I did a phone PST:   The patient was identified using 2 approved identifiers. All issues noted in this document were discussed and addressed, Christian Evans      voiced understanding and agreement with all preoperative instructions. The patient was emailed the surgery instructions per his request.      The patient was instructed to call our Admitting Office [phone] or [phone]) to complete their Pre-surgical Interview.    COVID Vaccine received:  []  No [x]  Yes Date of any COVID positive Test in last 90 days:  PCP - Dudley Ghee, MD  Cardiologist - none recently last by Dr. Addie Holstein, sent VM to [facility]. To get Cardiac clearance.   Chest x-ray - 06-06-2023  2v  Epic EKG -  06-06-2023  Epic Stress Test - 06-09-2011  Epic ECHO - 06-09-2011  Epic Cardiac Cath - Last one 03-27-2005 Dr. Addie Holstein, report scanned in Epic. (Has LHC and LAD stenting x 1 done  per CT chest done 03-04-1999 by Dr. Sharyn Deforest) CT Coronary Calcium score:   Bowel Prep - [x]  No  []   Yes ______  Pacemaker / ICD device [x]  No []  Yes   Spinal Cord Stimulator:[x]  No []  Yes       History of Sleep Apnea? [x]  No []  Yes   CPAP used?- []  No []  Yes    Does the patient monitor blood sugar?   [x]  N/A   []  No []  Yes  Patient has: [x]  NO Hx DM   []  Pre-DM   []  DM1  []   DM2  Blood Thinner / Instructions:  None Aspirin Instructions:  ASA 325 mg  stopped 12-02-23 per patient   ERAS Protocol Ordered: [x]  No  []  Yes Patient is to be NPO after: MN prior  Dental hx: []  Dentures:     Had all teeth removed but doesn't have any dentures.  []  N/A      []  Bridge or Partial:                   []  Loose or Damaged teeth:   Activity level: Able to walk up 2 flights of stairs without becoming significantly short of breath or having chest pain?  []  No   [x]    Yes   Anesthesia review: Hx MI- CAD- LHC w/ stent to LAD in 1998. Last LHC was done 03-27-2005, no follow up found after this.  GERD, HTN, Chronic BLE edema, ETOH  Patient denies shortness of breath, fever, cough and chest pain at PAT appointment.  Patient verbalized understanding and agreement to the Pre-Surgical Instructions that were given to them at this PAT appointment. Patient was also educated of the need to review these PAT instructions again prior to his/her surgery.I reviewed the appropriate phone numbers to call if they have any and questions or concerns.

## 2023-12-03 ENCOUNTER — Encounter (HOSPITAL_COMMUNITY)
Admission: RE | Admit: 2023-12-03 | Discharge: 2023-12-03 | Disposition: A | Discharge status: Home / Self Care | Source: Ambulatory Visit | Attending: Anesthesiology | Admitting: Anesthesiology

## 2023-12-03 ENCOUNTER — Encounter (HOSPITAL_COMMUNITY): Payer: Self-pay

## 2023-12-03 ENCOUNTER — Encounter (HOSPITAL_COMMUNITY): Payer: Self-pay | Admitting: Physician Assistant

## 2023-12-03 ENCOUNTER — Other Ambulatory Visit: Payer: Self-pay | Admitting: Urology

## 2023-12-03 VITALS — Ht 70.0 in | Wt 262.0 lb

## 2023-12-03 DIAGNOSIS — I2510 Atherosclerotic heart disease of native coronary artery without angina pectoris: Secondary | ICD-10-CM

## 2023-12-03 DIAGNOSIS — Z01818 Encounter for other preprocedural examination: Secondary | ICD-10-CM

## 2023-12-03 HISTORY — DX: Pneumonia, unspecified organism: J18.9

## 2023-12-03 HISTORY — DX: Unspecified osteoarthritis, unspecified site: M19.90

## 2023-12-03 HISTORY — DX: Peripheral vascular disease, unspecified: I73.9

## 2023-12-06 ENCOUNTER — Telehealth: Payer: Self-pay

## 2023-12-06 NOTE — Telephone Encounter (Signed)
   Pre-operative Risk Assessment    Patient Name: Christian Evans  DOB: 31-Jul-1950 MRN: [id number]   Date of last office visit: NONE Date of next office visit: 03/14/24 Christian Portal, MD (NEW PT APPT)   Request for Surgical Clearance    Procedure:  CYSTO, TRANSURETHRAL RESECTION OF BLASSER TUMOR  Date of Surgery:  Clearance TBD                                Surgeon:  Perley Bradley, MD Surgeon's Group or Practice Name:  [facility] Phone number:  [phone] Fax number:  [phone]   Type of Clearance Requested:   - Medical  - Pharmacy:  Hold Aspirin 5 DAYS BEFORE PROCEDURE   Type of Anesthesia:  Not Indicated   Additional requests/questions:    Signed, Collin Deal   12/06/2023, 11:35 AM

## 2023-12-07 ENCOUNTER — Encounter (HOSPITAL_COMMUNITY): Admission: RE | Payer: Self-pay | Source: Home / Self Care

## 2023-12-07 ENCOUNTER — Ambulatory Visit (HOSPITAL_COMMUNITY): Admission: RE | Admit: 2023-12-07 | Source: Home / Self Care | Admitting: Urology

## 2023-12-07 SURGERY — TURBT (TRANSURETHRAL RESECTION OF BLADDER TUMOR)
Anesthesia: General

## 2023-12-07 NOTE — Telephone Encounter (Signed)
 I left a message for the patient to call our office to see if patient would like to move scheduled office visit up.

## 2023-12-07 NOTE — Telephone Encounter (Signed)
   Name: Christian Evans  DOB: Aug 20, 1950  MRN: [id number]  Primary Cardiologist: None  Chart reviewed as part of pre-operative protocol coverage. Because of [name]'s past medical history, he will require a follow-up in-office visit in order to better assess preoperative cardiovascular risk.  Patient has never been seen by our practice. He has a new patient visit scheduled with Dr. Katheryne Pane on 03/14/2024. If surgery is planned for sooner than the end of September, new patient visit will need to be moved up.   Pre-op covering staff: - Please schedule appointment and call patient to inform them. If patient already had an upcoming appointment within acceptable timeframe, please add pre-op clearance to the appointment notes so provider is aware. - Please contact requesting surgeon's office via preferred method (i.e, phone, fax) to inform them of need for appointment prior to surgery.  Morey Ar, NP  12/07/2023, 9:29 AM

## 2023-12-08 ENCOUNTER — Ambulatory Visit: Admitting: Internal Medicine

## 2024-03-14 ENCOUNTER — Encounter: Payer: Self-pay | Admitting: Cardiovascular Disease

## 2024-03-14 ENCOUNTER — Ambulatory Visit: Attending: Cardiovascular Disease | Admitting: Cardiovascular Disease

## 2024-03-14 VITALS — BP 130/60 | HR 67 | Ht 70.0 in | Wt 270.0 lb

## 2024-03-14 DIAGNOSIS — E785 Hyperlipidemia, unspecified: Secondary | ICD-10-CM | POA: Insufficient documentation

## 2024-03-14 DIAGNOSIS — Z720 Tobacco use: Secondary | ICD-10-CM | POA: Insufficient documentation

## 2024-03-14 DIAGNOSIS — E782 Mixed hyperlipidemia: Secondary | ICD-10-CM | POA: Diagnosis not present

## 2024-03-14 DIAGNOSIS — I10 Essential (primary) hypertension: Secondary | ICD-10-CM | POA: Diagnosis not present

## 2024-03-14 DIAGNOSIS — I2510 Atherosclerotic heart disease of native coronary artery without angina pectoris: Secondary | ICD-10-CM | POA: Insufficient documentation

## 2024-03-14 DIAGNOSIS — Z01818 Encounter for other preprocedural examination: Secondary | ICD-10-CM | POA: Insufficient documentation

## 2024-03-14 NOTE — Assessment & Plan Note (Signed)
 History of essential hypertension blood pressure measured today 130/60.  He is on chlorthalidone, metoprolol and losartan.

## 2024-03-14 NOTE — Assessment & Plan Note (Signed)
 40-pack-year tobacco abuse having quit 20 years ago.

## 2024-03-14 NOTE — Patient Instructions (Signed)
 Medication Instructions:  Your physician recommends that you continue on your current medications as directed. Please refer to the Current Medication list given to you today.  *If you need a refill on your cardiac medications before your next appointment, please call your pharmacy*  Testing/Procedures: Dr. Court has ordered a Lexiscan Myocardial Perfusion Imaging Study.  Please arrive 15 minutes prior to your appointment time for registration and insurance purposes.   The test will take approximately 3 to 4 hours to complete; you may bring reading material.  If someone comes with you to your appointment, they will need to remain in the main lobby due to limited space in the testing area. **If you are pregnant or breastfeeding, please notify the nuclear lab prior to your appointment**   How to prepare for your Myocardial Perfusion Test: Do not eat or drink 3 hours prior to your test, except you may have water. Do not consume products containing caffeine (regular or decaffeinated) 12 hours prior to your test. (ex: coffee, chocolate, sodas, tea). Do wear comfortable clothes (no dresses or overalls) and walking shoes, tennis shoes preferred (No heels or open toe shoes are allowed). Do NOT wear cologne, perfume, aftershave, or lotions (deodorant is allowed). If you use an inhaler, use it the AM of your test and bring it with you.  If you use a nebulizer, use it the AM of your test.  If these instructions are not followed, your test will have to be rescheduled.   Your physician has requested that you have an echocardiogram. Echocardiography is a painless test that uses sound waves to create images of your heart. It provides your doctor with information about the size and shape of your heart and how well your heart's chambers and valves are working. This procedure takes approximately one hour. There are no restrictions for this procedure. Please do NOT wear cologne, perfume, aftershave, or lotions  (deodorant is allowed). Please arrive 15 minutes prior to your appointment time.  Please note: We ask at that you not bring children with you during ultrasound (echo/ vascular) testing. Due to room size and safety concerns, children are not allowed in the ultrasound rooms during exams. Our front office staff cannot provide observation of children in our lobby area while testing is being conducted. An adult accompanying a patient to their appointment will only be allowed in the ultrasound room at the discretion of the ultrasound technician under special circumstances. We apologize for any inconvenience.   Follow-Up: At [facility], you and your health needs are our priority.  As part of our continuing mission to provide you with exceptional heart care, our providers are all part of one team.  This team includes your primary Cardiologist (physician) and Advanced Practice Providers or APPs (Physician Assistants and Nurse Practitioners) who all work together to provide you with the care you need, when you need it.  Your next appointment:   We will see you on an as needed basis   Provider:   Dorn Court, MD    We recommend signing up for the patient portal called MyChart.  Sign up information is provided on this After Visit Summary.  MyChart is used to connect with patients for Virtual Visits (Telemedicine).  Patients are able to view lab/test results, encounter notes, upcoming appointments, etc.  Non-urgent messages can be sent to your provider as well.   To learn more about what you can do with MyChart, go to [email or web address].

## 2024-03-14 NOTE — Assessment & Plan Note (Signed)
 History of angioplasty and stenting by Dr. Annis in 1998.  Cath in 2000 showed no change.  He did have a heart cath performed by Dr. Claudene 03/28/2005 revealing 20 to 30% left main, some obtuse marginal branch disease and subtotally occluded right coronary artery with faint collaterals.  He has had normal LV function in the past.  He is completely asymptomatic.

## 2024-03-14 NOTE — Progress Notes (Signed)
 03/14/2024 Christian Evans   [birth year]  [id number]  Primary Physician Christian Ingle, MD Primary Cardiologist: Christian Christian Lesches MD Christian Evans, Christian Evans  HPI:  Christian Evans is a 73 y.o. 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  He is accompanied by his wife Christian Evans today.  They have been married for 53 years.  He was referred by the urology service for preoperative clearance before he is bladder/cystoscopic procedure done under general anesthesia.  He also needs DOT clearance.  His risk factors notable for treated hypertension hyperlipidemia.  He smoked 40 pack years and stopped 20 years ago.  There is no family history of heart disease.  He never had a heart attack or stroke.  He did have stenting by Dr. Annis in 1998.  His cath with the same in 2000.  His last catheterization performed by Dr. Claudene 03/28/2005 showed some obtuse marginal branch disease as well as a subtotally occluded right coronary artery with collateral filling.  Subsequent Myoview stress test was negative as was a 2D echo.  He is completely asymptomatic.   Current Meds  Medication Sig   allopurinol (ZYLOPRIM) 300 MG tablet Take 300 mg by mouth every evening.   aspirin 325 MG tablet Take 325 mg by mouth every evening.   chlorthalidone (HYGROTON) 25 MG tablet Take 25 mg by mouth at bedtime.   diphenhydramine-acetaminophen  (TYLENOL  PM) 25-500 MG TABS tablet Take 1 tablet by mouth at bedtime as needed (sleep).   felodipine (PLENDIL) 10 MG 24 hr tablet Take 10 mg by mouth every evening.   losartan (COZAAR) 100 MG tablet Take 100 mg by mouth every evening.   MELATONIN PO Take 1 tablet by mouth at bedtime as needed (sleep).   metoprolol succinate (TOPROL-XL) 50 MG 24 hr tablet Take 50 mg by mouth every evening. Take with or immediately following a meal.   rosuvastatin (CRESTOR) 10 MG tablet Take 10 mg by mouth  every evening.     Allergies  Allergen Reactions   Lisinopril Swelling    Lip swelling    Social History   Socioeconomic History   Marital status: Married    Spouse name: Not on file   Number of children: 0   Years of education: Not on file   Highest education level: Not on file  Occupational History   Occupation: disabled  Tobacco Use   Smoking status: Former   Smokeless tobacco: Never  Vaping Use   Vaping status: Never Used  Substance and Sexual Activity   Alcohol use: Yes    Comment: occ   Drug use: No   Sexual activity: Not Currently  Other Topics Concern   Not on file  Social History Narrative   Not on file   Social Drivers of Health   Financial Resource Strain: Not on file  Food Insecurity: Not on file  Transportation Needs: Not on file  Physical Activity: Not on file  Stress: Not on file  Social Connections: Not on file  Intimate Partner Violence: Not on file     Review of Systems: General: negative for chills, fever, night sweats or weight changes.  Cardiovascular: negative for chest pain, dyspnea on exertion, edema, orthopnea, palpitations, paroxysmal nocturnal dyspnea or shortness of breath Dermatological: negative for rash Respiratory: negative for cough or wheezing Urologic: negative for hematuria Abdominal: negative for nausea, vomiting, diarrhea, bright red blood per rectum, melena, or hematemesis  Neurologic: negative for visual changes, syncope, or dizziness All other systems reviewed and are otherwise negative except as noted above.    Blood pressure 130/60, pulse 67, height 5' 10 (1.778 m), weight 270 lb (122.5 kg).  General appearance: alert and no distress Neck: no adenopathy, no carotid bruit, no JVD, supple, symmetrical, trachea midline, and thyroid not enlarged, symmetric, no tenderness/mass/nodules Lungs: clear to auscultation bilaterally Heart: regular rate and rhythm, S1, S2 normal, no murmur, click, rub or gallop Extremities:  extremities normal, atraumatic, no cyanosis or edema Pulses: 2+ and symmetric Skin: Skin color, texture, turgor normal. No rashes or lesions Neurologic: Grossly normal  EKG EKG Interpretation Date/Time:  Tuesday March 14 2024 14:24:30 EDT Ventricular Rate:  69 PR Interval:  164 QRS Duration:  94 QT Interval:  380 QTC Calculation: 407 R Axis:   -20  Text Interpretation: Normal sinus rhythm Inferior infarct , age undetermined When compared with ECG of 06-Jun-2023 05:26, PREVIOUS ECG IS PRESENT Confirmed by Christian Evans [phone]) on 03/14/2024 2:32:23 PM    ASSESSMENT AND PLAN:   Tobacco abuse 40-pack-year tobacco abuse having quit 20 years ago.  Essential hypertension History of essential hypertension blood pressure measured today 130/60.  He is on chlorthalidone, metoprolol and losartan.  Hyperlipidemia History of hyperlipidemia on statin therapy followed by his PCP  Coronary artery disease History of angioplasty and stenting by Dr. Annis in 1998.  Cath in 2000 showed no change.  He did have a heart cath performed by Dr. Claudene 03/28/2005 revealing 20 to 30% left main, some obtuse marginal branch disease and subtotally occluded right coronary artery with faint collaterals.  He has had normal LV function in the past.  He is completely asymptomatic.  Morbid obesity (HCC) BMI 39.  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  Given his past cardiac history I am going to get a 2D echo and a Lexiscan Myoview stress test to risk stratify.     Evans Christian Court MD FACP,FACC,FAHA, [facility] 03/14/2024 2:46 PM

## 2024-03-14 NOTE — Assessment & Plan Note (Signed)
History of hyperlipidemia on statin therapy followed by his PCP 

## 2024-03-14 NOTE — Assessment & Plan Note (Signed)
 BMI 39.  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

## 2024-03-14 NOTE — Assessment & Plan Note (Signed)
 Patient scheduled for a cystoscopic procedure under general anesthesia.  Given his past cardiac history I am going to get a 2D echo and a Lexiscan Myoview stress test to risk stratify.

## 2024-03-23 ENCOUNTER — Encounter (HOSPITAL_COMMUNITY): Payer: Self-pay | Admitting: *Deleted

## 2024-04-11 ENCOUNTER — Other Ambulatory Visit: Payer: Self-pay | Admitting: Cardiovascular Disease

## 2024-04-11 DIAGNOSIS — I10 Essential (primary) hypertension: Secondary | ICD-10-CM

## 2024-04-11 DIAGNOSIS — I2510 Atherosclerotic heart disease of native coronary artery without angina pectoris: Secondary | ICD-10-CM

## 2024-04-11 DIAGNOSIS — Z01818 Encounter for other preprocedural examination: Secondary | ICD-10-CM

## 2024-04-11 DIAGNOSIS — Z720 Tobacco use: Secondary | ICD-10-CM

## 2024-04-11 DIAGNOSIS — E782 Mixed hyperlipidemia: Secondary | ICD-10-CM

## 2024-04-13 ENCOUNTER — Ambulatory Visit (HOSPITAL_COMMUNITY)
Admission: RE | Admit: 2024-04-13 | Discharge: 2024-04-13 | Disposition: A | Discharge status: Home / Self Care | Source: Ambulatory Visit | Attending: Internal Medicine | Admitting: Internal Medicine

## 2024-04-13 DIAGNOSIS — Z01810 Encounter for preprocedural cardiovascular examination: Secondary | ICD-10-CM | POA: Insufficient documentation

## 2024-04-13 DIAGNOSIS — E782 Mixed hyperlipidemia: Secondary | ICD-10-CM | POA: Diagnosis present

## 2024-04-13 DIAGNOSIS — Z720 Tobacco use: Secondary | ICD-10-CM | POA: Diagnosis present

## 2024-04-13 DIAGNOSIS — I2510 Atherosclerotic heart disease of native coronary artery without angina pectoris: Secondary | ICD-10-CM | POA: Diagnosis present

## 2024-04-13 DIAGNOSIS — I10 Essential (primary) hypertension: Secondary | ICD-10-CM | POA: Diagnosis present

## 2024-04-13 DIAGNOSIS — Z01818 Encounter for other preprocedural examination: Secondary | ICD-10-CM | POA: Insufficient documentation

## 2024-04-13 LAB — MYOCARDIAL PERFUSION IMAGING
LV dias vol: 113 mL (ref 62–150)
LV sys vol: 36 mL (ref 4.2–5.8)
Nuc Stress EF: 68 %
Peak HR: 97 {beats}/min
Rest HR: 87 {beats}/min
Rest Nuclear Isotope Dose: 13 mCi
SDS: 0
SRS: 0
SSS: 0
ST Depression (mm): 0 mm
Stress Nuclear Isotope Dose: 38 mCi
TID: 0.89

## 2024-04-13 MED ORDER — REGADENOSON 0.4 MG/5ML IV SOLN
0.4000 mg | Freq: Once | INTRAVENOUS | Status: AC
  Administered 2024-04-13: 0.4 mg via INTRAVENOUS

## 2024-04-13 MED ORDER — TECHNETIUM TC 99M TETROFOSMIN IV KIT
13.0000 | PACK | Freq: Once | INTRAVENOUS | Status: AC | PRN
  Administered 2024-04-13: 13 via INTRAVENOUS

## 2024-04-13 MED ORDER — REGADENOSON 0.4 MG/5ML IV SOLN
INTRAVENOUS | Status: AC
  Filled 2024-04-13: qty 5

## 2024-04-13 MED ORDER — TECHNETIUM TC 99M TETROFOSMIN IV KIT
38.0000 | PACK | Freq: Once | INTRAVENOUS | Status: AC | PRN
  Administered 2024-04-13: 38 via INTRAVENOUS

## 2024-04-14 ENCOUNTER — Ambulatory Visit: Payer: Self-pay | Admitting: Cardiovascular Disease

## 2024-04-14 ENCOUNTER — Telehealth: Payer: Self-pay | Admitting: Cardiovascular Disease

## 2024-04-14 NOTE — Telephone Encounter (Signed)
 Patient is requesting a not stating he is cleared to return to work based on stress test results. Patient says he is scheduled to work.

## 2024-04-14 NOTE — Telephone Encounter (Signed)
 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 He is scheduled to return to work 4:30 AM Monday, 10/27, but Dr. Court doesn't return to the office until 11/05. He says when he spoke with Dr. Court he was told that he should be fine to work as long as stress test results are fine. Please advise.

## 2024-04-14 NOTE — Telephone Encounter (Signed)
 Sent to Dr Court to confirm patient can return to work  with out restrcitons

## 2024-04-15 ENCOUNTER — Ambulatory Visit: Payer: Self-pay | Admitting: Cardiovascular Disease

## 2024-04-17 NOTE — Telephone Encounter (Signed)
 Spoke to patient . He is aware  he can pick  2 letters on Wednesayd after he has Eco complete one for returning back to work on Monday 04/17/24 and one for missing work on 10/29/ 25

## 2024-04-18 ENCOUNTER — Encounter: Payer: Self-pay | Admitting: *Deleted

## 2024-04-18 NOTE — Telephone Encounter (Signed)
 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 OK to work for DOT. Can provide note to that effect Dorn Lesches, MD 04/15/24  1:51 PM

## 2024-04-19 ENCOUNTER — Ambulatory Visit (HOSPITAL_COMMUNITY)
Admission: RE | Admit: 2024-04-19 | Discharge: 2024-04-19 | Disposition: A | Discharge status: Home / Self Care | Source: Ambulatory Visit | Attending: Cardiology | Admitting: Cardiology

## 2024-04-19 DIAGNOSIS — Z01818 Encounter for other preprocedural examination: Secondary | ICD-10-CM | POA: Insufficient documentation

## 2024-04-19 DIAGNOSIS — E782 Mixed hyperlipidemia: Secondary | ICD-10-CM | POA: Diagnosis present

## 2024-04-19 DIAGNOSIS — I2510 Atherosclerotic heart disease of native coronary artery without angina pectoris: Secondary | ICD-10-CM | POA: Diagnosis present

## 2024-04-19 DIAGNOSIS — I10 Essential (primary) hypertension: Secondary | ICD-10-CM | POA: Diagnosis present

## 2024-04-19 LAB — ECHOCARDIOGRAM COMPLETE
Area-P 1/2: 3.6 cm2
Est EF: 60
S' Lateral: 3.5 cm

## 2024-04-26 ENCOUNTER — Telehealth (HOSPITAL_BASED_OUTPATIENT_CLINIC_OR_DEPARTMENT_OTHER): Payer: Self-pay | Admitting: *Deleted

## 2024-04-26 ENCOUNTER — Telehealth: Payer: Self-pay | Admitting: Cardiovascular Disease

## 2024-04-26 NOTE — Telephone Encounter (Signed)
 Pt has been scheduled tele preop appt 05/05/24. Med rec and consent are done.      Patient Consent for Virtual Visit        Christian Evans has provided verbal consent on 04/26/2024 for a virtual visit (video or telephone).   CONSENT FOR VIRTUAL VISIT FOR:  Christian Evans  By participating in this virtual visit I agree to the following:  I hereby voluntarily request, consent and authorize [facility] and its employed or contracted physicians, physician assistants, nurse practitioners or other licensed health care professionals (the Practitioner), to provide me with telemedicine health care services (the "Services) as deemed necessary by the treating Practitioner. I acknowledge and consent to receive the Services by the Practitioner via telemedicine. I understand that the telemedicine visit will involve communicating with the Practitioner through live audiovisual communication technology and the disclosure of certain medical information by electronic transmission. I acknowledge that I have been given the opportunity to request an in-person assessment or other available alternative prior to the telemedicine visit and am voluntarily participating in the telemedicine visit.  I understand that I have the right to withhold or withdraw my consent to the use of telemedicine in the course of my care at any time, without affecting my right to future care or treatment, and that the Practitioner or I may terminate the telemedicine visit at any time. I understand that I have the right to inspect all information obtained and/or recorded in the course of the telemedicine visit and may receive copies of available information for a reasonable fee.  I understand that some of the potential risks of receiving the Services via telemedicine include:  Delay or interruption in medical evaluation due to technological equipment failure or disruption; Information transmitted may not be sufficient (e.g. poor  resolution of images) to allow for appropriate medical decision making by the Practitioner; and/or  In rare instances, security protocols could fail, causing a breach of personal health information.  Furthermore, I acknowledge that it is my responsibility to provide information about my medical history, conditions and care that is complete and accurate to the best of my ability. I acknowledge that Practitioner's advice, recommendations, and/or decision may be based on factors not within their control, such as incomplete or inaccurate data provided by me or distortions of diagnostic images or specimens that may result from electronic transmissions. I understand that the practice of medicine is not an exact science and that Practitioner makes no warranties or guarantees regarding treatment outcomes. I acknowledge that a copy of this consent can be made available to me via my patient portal [facility] MyChart), or I can request a printed copy by calling the office of [facility].    I understand that my insurance will be billed for this visit.   I have read or had this consent read to me. I understand the contents of this consent, which adequately explains the benefits and risks of the Services being provided via telemedicine.  I have been provided ample opportunity to ask questions regarding this consent and the Services and have had my questions answered to my satisfaction. I give my informed consent for the services to be provided through the use of telemedicine in my medical care

## 2024-04-26 NOTE — Telephone Encounter (Signed)
   Pre-operative Risk Assessment    Patient Name: Christian Evans  DOB: April 29, 1951 MRN: [id number]   Date of last office visit: 03/14/24 Date of next office visit: n/a   Request for Surgical Clearance    Procedure:  cystoscopy with transurethral resection of bladder tumor   Date of Surgery:  Clearance TBD                                Surgeon:  Dr. Valli Shank Surgeon's Group or Practice Name:  [facility] Phone number:  [phone] Fax number:  [phone]   Type of Clearance Requested:   - Medical  - Pharmacy:  Hold Aspirin 5 days before    Type of Anesthesia:  General    Additional requests/questions:     SignedBarbee DELENA Sharps   04/26/2024, 10:30 AM

## 2024-04-26 NOTE — Telephone Encounter (Signed)
 Pt has been scheduled tele preop appt 05/05/24. Med rec and consent are done.

## 2024-04-26 NOTE — Telephone Encounter (Signed)
   Name: Christian Evans  DOB: 1951/02/10  MRN: [id number]  Primary Cardiologist: None   Preoperative team, please contact this patient and set up a phone call appointment for further preoperative risk assessment. Please obtain consent and complete medication review. Thank you for your help.  I confirm that guidance regarding antiplatelet and oral anticoagulation therapy has been completed and, if necessary, noted below.  Per office protocol, if patient is without any new symptoms or concerns at the time of their virtual visit, he may hold ASA for 7 days prior to procedure. Please resume ASA as soon as possible postprocedure, at the discretion of the surgeon.    I also confirmed the patient resides in the state of [place] . As per [facility] telemedicine laws, the patient must reside in the state in which the provider is licensed.   Lamarr Satterfield, NP 04/26/2024, 1:03 PM [facility]

## 2024-05-05 ENCOUNTER — Ambulatory Visit: Attending: Cardiovascular Disease

## 2024-05-05 DIAGNOSIS — Z01810 Encounter for preprocedural cardiovascular examination: Secondary | ICD-10-CM | POA: Diagnosis not present

## 2024-05-05 NOTE — Progress Notes (Signed)
 Virtual Visit via Telephone Note   Because of Christian Evans co-morbid illnesses, he is at least at moderate risk for complications without adequate follow up.  This format is felt to be most appropriate for this patient at this time.  Due to technical limitations with video connection (technology), today's appointment will be conducted as an audio only telehealth visit, and Christian Evans verbally agreed to proceed in this manner.   All issues noted in this document were discussed and addressed.  No physical exam could be performed with this format.  Evaluation Performed:  Preoperative cardiovascular risk assessment _____________   Date:  05/05/2024   Patient ID:  Christian Evans, DOB 08/28/1950, MRN [id number] Patient Location:  Home Provider location:   Office  Primary Care Provider:  Henry Ingle, MD Primary Cardiologist:  None  Chief Complaint / Patient Profile   73 y.o. y/o male with a h/o essential hypertension, coronary artery disease, hyperlipidemia, obesity who is pending cystoscopy and presents today for telephonic preoperative cardiovascular risk assessment.  History of Present Illness    Christian Evans is a 73 y.o. male who presents via audio/video conferencing for a telehealth visit today.  Pt was last seen in cardiology clinic on 02/23/2024 by Dr.Berry.  At that time Christian Evans was doing well .  The patient is now pending procedure as outlined above. Since his last visit, he continues to be stable from a cardiac standpoint.  Today he denies chest pain, shortness of breath, lower extremity edema, fatigue, palpitations, melena, hematuria, hemoptysis, diaphoresis, weakness, presyncope, syncope, orthopnea, and PND.   Past Medical History    Past Medical History:  Diagnosis Date   Arthritis    Chronic edema    lower Extremity   Coronary artery disease    GERD (gastroesophageal reflux disease)    Gout    Hx of angina pectoris    Hypertension     Myocardial infarction (HCC) 2000   LHC- DES x 1   Obesity    Peripheral vascular disease    Pneumonia    Past Surgical History:  Procedure Laterality Date   ARM SURGERY Right 1978   ORIF elbow from 3- story fall   CORONARY STENT PLACEMENT  1998   done at [facility], per patient    DES x 1   CYST REMOVAL TRUNK     benign cyst   FOOT SURGERY Left 1978   ORIF heel / ankle    Allergies  Allergies  Allergen Reactions   Lisinopril Swelling    Lip swelling    Home Medications    Prior to Admission medications   Medication Sig Start Date End Date Taking? Authorizing Provider  allopurinol (ZYLOPRIM) 300 MG tablet Take 300 mg by mouth every evening.    [provider]  aspirin 325 MG tablet Take 325 mg by mouth every evening.    [provider]  chlorthalidone (HYGROTON) 25 MG tablet Take 25 mg by mouth at bedtime.    [provider]  diphenhydramine-acetaminophen  (TYLENOL  PM) 25-500 MG TABS tablet Take 1 tablet by mouth at bedtime as needed (sleep).    [provider]  felodipine (PLENDIL) 10 MG 24 hr tablet Take 10 mg by mouth every evening.    [provider]  losartan (COZAAR) 100 MG tablet Take 100 mg by mouth every evening.    [provider]  MELATONIN PO Take 1 tablet by mouth at bedtime as needed (sleep).    [provider]  metoprolol succinate (TOPROL-XL) 50 MG 24 hr tablet Take 50 mg by mouth every evening. Take with or immediately following a meal.    [provider]  rosuvastatin (CRESTOR) 10 MG tablet Take 10 mg by mouth every evening.    [provider]  omeprazole (PRILOSEC) 20 MG capsule Take 20 mg by mouth daily.  09/04/13  [provider]  testosterone (ANDRODERM) 2.5 MG/24HR Place 1 patch onto the skin daily.  09/04/13  [provider]    Physical Exam    Vital Signs:  Christian Evans does not have vital signs available for review today.  Given telephonic nature of  communication, physical exam is limited. AAOx3. NAD. Normal affect.  Speech and respirations are unlabored.  Accessory Clinical Findings    None  Assessment & Plan    1.  Preoperative Cardiovascular Risk Assessment: cystoscopy with transurethral resection of bladder tumor    Date of Surgery:  Clearance TBD                                  Surgeon:  Dr. Valli Shank Surgeon's Group or Practice Name:  [facility] Phone number:  [phone] Fax number:  [phone]    Primary Cardiologist: Dr. Court  Chart reviewed as part of pre-operative protocol coverage. Given past medical history and time since last visit, based on ACC/AHA guidelines, Christian Evans would be at acceptable risk for the planned procedure without further cardiovascular testing.   His RCRI is low risk, 0.9% risk of major cardiac event.  He is able to complete greater than 4 METS of physical activity.  Patient was advised that if he develops new symptoms prior to surgery to contact our office to arrange a follow-up appointment.  He verbalized understanding.  His aspirin may be held for 5-7 days prior to his procedure.  Please resume as soon as hemostasis is achieved.  I will route this recommendation to the requesting party via Epic fax function and remove from pre-op pool.       Time:   Today, I have spent  5 minutes with the patient with telehealth technology discussing medical history, symptoms, and management plan.  I spent 10 minutes reviewing patient's past cardiac history and cardiac medications.    Christian CHRISTELLA Beauvais, NP  05/05/2024, 7:03 AM

## 2024-05-09 ENCOUNTER — Other Ambulatory Visit: Payer: Self-pay | Admitting: Urology

## 2024-06-08 NOTE — Progress Notes (Signed)
 Date of COVID positive in last 90 days:  PCP - Tanda Rummer, MD Cardiologist - Dorn Lesches, MD  Cardiac clearance in Epic dated   Chest x-ray - N/A EKG - 03-14-24 Epic Stress Test - 04-13-24 Epic ECHO - 04-19-24 Epic Cardiac Cath - 03-27-05 Epic Pacemaker/ICD device last checked:N/A Spinal Cord Stimulator:N/A  Bowel Prep - N/A  Sleep Study - N/A CPAP -   Fasting Blood Sugar - N/A Checks Blood Sugar _____ times a day  Last dose of GLP1 agonist-  N/A GLP1 instructions:  Do not take after     Last dose of SGLT-2 inhibitors-  N/A SGLT-2 instructions:  Do not take after     Blood Thinner Instructions: N/A Last dose:   Time: Aspirin Instructions:  ASA 325 (hold 5-7 days per clearance) Last Dose:  Activity level:  Can go up a flight of stairs and perform activities of daily living without stopping and without symptoms of chest pain or shortness of breath.  Able to exercise without symptoms  Unable to go up a flight of stairs without symptoms of     Anesthesia review: CAD, hx of MI, HTN  Patient denies shortness of breath, fever, cough and chest pain at PAT appointment  Patient verbalized understanding of instructions that were given to them at the PAT appointment. Patient was also instructed that they will need to review over the PAT instructions again at home before surgery.

## 2024-06-08 NOTE — Patient Instructions (Addendum)
 SURGICAL WAITING ROOM VISITATION Patients having surgery or a procedure may have no more than 2 support people in the waiting area - these visitors may rotate.    Children under the age of 22 will not be allowed to visit due to the increase in respiratory illness  Children under the age of 31 must have an adult with them who is not the patient.  If the patient needs to stay at the hospital during part of their recovery, the visitor guidelines for inpatient rooms apply. Pre-op nurse will coordinate an appropriate time for 1 support person to accompany patient in pre-op.  This support person may not rotate.    Please refer to the [facility] website for the visitor guidelines for Inpatients (after your surgery is over and you are in a regular room).      Your procedure is scheduled on: 06-13-24   Report to [facility] Main Entrance    Report to admitting at 5:15 AM   Call this number if you have problems the morning of surgery [phone]   Do not eat food or drink liquids :After Midnight.           If you have questions, please contact your surgeons office.   FOLLOW ANY ADDITIONAL PRE OP INSTRUCTIONS YOU RECEIVED FROM YOUR SURGEON'S OFFICE!!!     Oral Hygiene is also important to reduce your risk of infection.                                    Remember - BRUSH YOUR TEETH THE MORNING OF SURGERY WITH YOUR REGULAR TOOTHPASTE   Do NOT smoke after Midnight   Take these medicines the morning of surgery with A SIP OF WATER:    None  Stop all vitamins and herbal supplements 7 days before surgery  Bring CPAP mask and tubing day of surgery.                              You may not have any metal on your body including  jewelry, and body piercing             Do not wear  lotions, powders, cologne, or deodorant              Men may shave face and neck.   Do not bring valuables to the hospital.  IS NOT RESPONSIBLE   FOR VALUABLES.   Contacts, dentures or  bridgework may not be worn into surgery.   DO NOT BRING YOUR HOME MEDICATIONS TO THE HOSPITAL. PHARMACY WILL DISPENSE MEDICATIONS LISTED ON YOUR MEDICATION LIST TO YOU DURING YOUR ADMISSION IN THE HOSPITAL!    Patients discharged on the day of surgery will not be allowed to drive home.  Someone NEEDS to stay with you for the first 24 hours after anesthesia.   Special Instructions: Bring a copy of your healthcare power of attorney and living will documents the day of surgery if you haven't scanned them before.              Please read over the following fact sheets you were given: IF YOU HAVE QUESTIONS ABOUT YOUR PRE-OP INSTRUCTIONS PLEASE CALL [phone] Gwen  If you received a COVID test during your pre-op visit  it is requested that you wear a mask when out in public, stay away from anyone that may  not be feeling well and notify your surgeon if you develop symptoms. If you test positive for Covid or have been in contact with anyone that has tested positive in the last 10 days please notify you surgeon.  [facility] - Preparing for Surgery Before surgery, you can play an important role.  Because skin is not sterile, your skin needs to be as free of germs as possible.  You can reduce the number of germs on your skin by washing with CHG (chlorahexidine gluconate) soap before surgery.  CHG is an antiseptic cleaner which kills germs and bonds with the skin to continue killing germs even after washing. Please DO NOT use if you have an allergy to CHG or antibacterial soaps.  If your skin becomes reddened/irritated stop using the CHG and inform your nurse when you arrive at Short Stay. Do not shave (including legs and underarms) for at least 48 hours prior to the first CHG shower.  You may shave your face/neck.  Please follow these instructions carefully:  1.  Shower with CHG Soap the night before surgery and the  morning of surgery.  2.  If you choose to wash your hair, wash your hair first as usual  with your normal  shampoo.  3.  After you shampoo, rinse your hair and body thoroughly to remove the shampoo.                             4.  Use CHG as you would any other liquid soap.  You can apply chg directly to the skin and wash.  Gently with a scrungie or clean washcloth.  5.  Apply the CHG Soap to your body ONLY FROM THE NECK DOWN.   Do   not use on face/ open                           Wound or open sores. Avoid contact with eyes, ears mouth and   genitals (private parts).                       Wash face,  Genitals (private parts) with your normal soap.             6.  Wash thoroughly, paying special attention to the area where your    surgery  will be performed.  7.  Thoroughly rinse your body with warm water from the neck down.  8.  DO NOT shower/wash with your normal soap after using and rinsing off the CHG Soap.                9.  Pat yourself dry with a clean towel.            10.  Wear clean pajamas.            11.  Place clean sheets on your bed the night of your first shower and do not  sleep with pets. Day of Surgery : Do not apply any lotions/deodorants the morning of surgery.  Please wear clean clothes to the hospital/surgery center.  FAILURE TO FOLLOW THESE INSTRUCTIONS MAY RESULT IN THE CANCELLATION OF YOUR SURGERY  PATIENT SIGNATURE_________________________________  NURSE SIGNATURE__________________________________  ________________________________________________________________________

## 2024-06-11 ENCOUNTER — Encounter (HOSPITAL_COMMUNITY): Payer: Self-pay

## 2024-06-11 NOTE — Progress Notes (Signed)
 " Case: [id number] Date/Time: 06/13/24 0715   Procedure: TURBT (TRANSURETHRAL RESECTION OF BLADDER TUMOR) - CYSTOSCOPY WITH TURBT (TRANSURETHRAL RESECTION OF BLADDER TUMOR)   Anesthesia type: General   Diagnosis: Gross hematuria [R31.0]   Pre-op diagnosis: GROSS HEMATURIA   Location: [facility] PROCEDURE [facility] / [facility]   Surgeons: Elisabeth Valli BIRCH, MD       DISCUSSION: Christian Evans is a 73 yo male with PMH of former smoking, CAD s/p stenting in 1998, PVD, GERD, arthritis.  Patient seen by Dr. Court on 03/14/24 for pre op clearance. Per notes he has hx of CAD s/p stenting in 1998. PMH lists hx of MI however Cardiology notes state he has not had an MI. His last catheterization performed by Dr. Claudene 03/28/2005 showed some obtuse marginal branch disease as well as a subtotally occluded right coronary artery with collateral filling.  Stress testing and echo were ordered to risk stratify him. Stress test results were normal/low risk. Echo showed LVEF 60%, grade I DD, normal valves. He was cleared for surgery:= in 11/14 tele visit:  Chart reviewed as part of pre-operative protocol coverage. Given past medical history and time since last visit, based on ACC/AHA guidelines, [name] would be at acceptable risk for the planned procedure without further cardiovascular testing.  His RCRI is low risk, 0.9% risk of major cardiac event.  He is able to complete greater than 4 METS of physical activity. Patient was advised that if he develops new symptoms prior to surgery to contact our office to arrange a follow-up appointment.  He verbalized understanding. His aspirin may be held for 5-7 days prior to his procedure.  Please resume as soon as hemostasis is achieved.   VS: There were no vitals taken for this visit.  PROVIDERS: Henry Ingle, MD   LABS: {CHL AN LABS REVIEWED:112001::Labs reviewed: Acceptable for surgery.} (all labs ordered are listed, but only abnormal results are  displayed)  Labs Reviewed - No data to display   CT Chest overread 04/13/24:  IMPRESSION: 1. Diffuse hepatic steatosis. 2. Aortic atherosclerosis.  EKG 03/14/24:  NSR Inferior infarct, age undetermined    Echo 04/19/24:  IMPRESSIONS    1. Left ventricular ejection fraction, by estimation, is 60%. Left ventricular ejection fraction by 3D volume is 59 %. The left ventricle has normal function. The left ventricle has no regional wall motion abnormalities. There is mild concentric left ventricular hypertrophy. Left ventricular diastolic parameters are consistent with Grade I diastolic dysfunction (impaired relaxation). The average left ventricular global longitudinal strain is -24.1 %. The global longitudinal strain is normal.  2. Right ventricular systolic function is normal. The right ventricular size is mildly enlarged.  3. The mitral valve is normal in structure. No evidence of mitral valve regurgitation. No evidence of mitral stenosis.  4. The aortic valve is tricuspid. Aortic valve regurgitation is not visualized. No aortic stenosis is present.  Comparison(s): Unable to view 2012 study.  Stress test 04/13/24:    The study is normal. The study is low risk.   No ST deviation was noted.   LV perfusion is normal.   Left ventricular function is normal. End diastolic cavity size is normal. End systolic cavity size is normal.   CT images were obtained for attenuation correction and were examined for the presence of coronary calcium when appropriate.   Coronary calcium was present on the attenuation correction CT images. Moderate coronary calcifications were present. Coronary calcifications were present in the left anterior descending artery and left  circumflex artery distribution(s).   Prior study available for comparison. There are changes compared to prior study. The left ventricular ejection fraction has increased.   Electronically signed by Jerel Balding, MD  Past  Medical History:  Diagnosis Date   Arthritis    Chronic edema    lower Extremity   Coronary artery disease    GERD (gastroesophageal reflux disease)    Gout    Hx of angina pectoris    Hypertension    Myocardial infarction (HCC) 2000   LHC- DES x 1   Obesity    Peripheral vascular disease    Pneumonia     Past Surgical History:  Procedure Laterality Date   ARM SURGERY Right 1978   ORIF elbow from 3- story fall   CORONARY STENT PLACEMENT  1998   done at [facility], per patient    DES x 1   CYST REMOVAL TRUNK     benign cyst   FOOT SURGERY Left 1978   ORIF heel / ankle    MEDICATIONS:  allopurinol (ZYLOPRIM) 300 MG tablet   aspirin 325 MG tablet   chlorthalidone (HYGROTON) 25 MG tablet   diphenhydramine-acetaminophen  (TYLENOL  PM) 25-500 MG TABS tablet   felodipine (PLENDIL) 10 MG 24 hr tablet   losartan (COZAAR) 100 MG tablet   MELATONIN PO   metoprolol succinate (TOPROL-XL) 50 MG 24 hr tablet   rosuvastatin (CRESTOR) 10 MG tablet   tamsulosin (FLOMAX) 0.4 MG CAPS capsule   No current facility-administered medications for this encounter.          "

## 2024-06-12 ENCOUNTER — Other Ambulatory Visit: Payer: Self-pay

## 2024-06-12 ENCOUNTER — Encounter (HOSPITAL_COMMUNITY)
Admission: RE | Admit: 2024-06-12 | Discharge: 2024-06-12 | Disposition: A | Discharge status: Home / Self Care | Source: Ambulatory Visit | Attending: Urology | Admitting: Urology

## 2024-06-12 ENCOUNTER — Encounter (HOSPITAL_COMMUNITY): Payer: Self-pay

## 2024-06-12 VITALS — BP 153/87 | HR 100 | Temp 98.0°F | Resp 18 | Ht 70.0 in | Wt 272.2 lb

## 2024-06-12 DIAGNOSIS — Z7982 Long term (current) use of aspirin: Secondary | ICD-10-CM | POA: Diagnosis not present

## 2024-06-12 DIAGNOSIS — Z955 Presence of coronary angioplasty implant and graft: Secondary | ICD-10-CM | POA: Diagnosis not present

## 2024-06-12 DIAGNOSIS — Z01812 Encounter for preprocedural laboratory examination: Secondary | ICD-10-CM | POA: Insufficient documentation

## 2024-06-12 DIAGNOSIS — Z01818 Encounter for other preprocedural examination: Secondary | ICD-10-CM | POA: Diagnosis present

## 2024-06-12 DIAGNOSIS — Z87891 Personal history of nicotine dependence: Secondary | ICD-10-CM | POA: Diagnosis not present

## 2024-06-12 DIAGNOSIS — K219 Gastro-esophageal reflux disease without esophagitis: Secondary | ICD-10-CM | POA: Insufficient documentation

## 2024-06-12 DIAGNOSIS — I739 Peripheral vascular disease, unspecified: Secondary | ICD-10-CM | POA: Insufficient documentation

## 2024-06-12 DIAGNOSIS — I2510 Atherosclerotic heart disease of native coronary artery without angina pectoris: Secondary | ICD-10-CM | POA: Diagnosis not present

## 2024-06-12 DIAGNOSIS — R310 Gross hematuria: Secondary | ICD-10-CM | POA: Insufficient documentation

## 2024-06-12 DIAGNOSIS — M1990 Unspecified osteoarthritis, unspecified site: Secondary | ICD-10-CM | POA: Insufficient documentation

## 2024-06-12 HISTORY — DX: Prediabetes: R73.03

## 2024-06-12 LAB — BASIC METABOLIC PANEL WITH GFR
Anion gap: 15 (ref 5–15)
BUN: 7 mg/dL — ABNORMAL LOW (ref 8–23)
CO2: 28 mmol/L (ref 22–32)
Calcium: 9.3 mg/dL (ref 8.9–10.3)
Chloride: 95 mmol/L — ABNORMAL LOW (ref 98–111)
Creatinine, Ser: 0.78 mg/dL (ref 0.61–1.24)
GFR, Estimated: >60 mL/min
Glucose, Bld: 132 mg/dL — ABNORMAL HIGH (ref 70–99)
Potassium: 3.5 mmol/L (ref 3.5–5.1)
Sodium: 138 mmol/L (ref 135–145)

## 2024-06-13 ENCOUNTER — Ambulatory Visit (HOSPITAL_COMMUNITY): Admitting: Certified Registered"

## 2024-06-13 ENCOUNTER — Ambulatory Visit (HOSPITAL_COMMUNITY)
Admission: RE | Admit: 2024-06-13 | Discharge: 2024-06-13 | Disposition: A | Discharge status: Home / Self Care | Attending: Urology | Admitting: Urology

## 2024-06-13 ENCOUNTER — Encounter (HOSPITAL_COMMUNITY): Payer: Self-pay | Admitting: Urology

## 2024-06-13 ENCOUNTER — Encounter: Admission: RE | Disposition: A | Payer: Self-pay | Attending: Urology

## 2024-06-13 ENCOUNTER — Encounter (HOSPITAL_COMMUNITY): Admitting: Medical

## 2024-06-13 DIAGNOSIS — I2510 Atherosclerotic heart disease of native coronary artery without angina pectoris: Secondary | ICD-10-CM | POA: Diagnosis not present

## 2024-06-13 DIAGNOSIS — I10 Essential (primary) hypertension: Secondary | ICD-10-CM | POA: Diagnosis not present

## 2024-06-13 DIAGNOSIS — I739 Peripheral vascular disease, unspecified: Secondary | ICD-10-CM | POA: Insufficient documentation

## 2024-06-13 DIAGNOSIS — Z6839 Body mass index (BMI) 39.0-39.9, adult: Secondary | ICD-10-CM | POA: Diagnosis not present

## 2024-06-13 DIAGNOSIS — Z87891 Personal history of nicotine dependence: Secondary | ICD-10-CM | POA: Diagnosis not present

## 2024-06-13 DIAGNOSIS — D494 Neoplasm of unspecified behavior of bladder: Secondary | ICD-10-CM

## 2024-06-13 DIAGNOSIS — E66813 Obesity, class 3: Secondary | ICD-10-CM | POA: Insufficient documentation

## 2024-06-13 DIAGNOSIS — D090 Carcinoma in situ of bladder: Secondary | ICD-10-CM | POA: Diagnosis not present

## 2024-06-13 DIAGNOSIS — R310 Gross hematuria: Secondary | ICD-10-CM | POA: Diagnosis present

## 2024-06-13 DIAGNOSIS — N3021 Other chronic cystitis with hematuria: Secondary | ICD-10-CM | POA: Insufficient documentation

## 2024-06-13 DIAGNOSIS — Z951 Presence of aortocoronary bypass graft: Secondary | ICD-10-CM | POA: Insufficient documentation

## 2024-06-13 DIAGNOSIS — M1990 Unspecified osteoarthritis, unspecified site: Secondary | ICD-10-CM | POA: Diagnosis not present

## 2024-06-13 HISTORY — PX: TRANSURETHRAL RESECTION OF BLADDER TUMOR: SHX2575

## 2024-06-13 SURGERY — TURBT (TRANSURETHRAL RESECTION OF BLADDER TUMOR)
Anesthesia: General | Site: Bladder

## 2024-06-13 MED ORDER — DEXAMETHASONE SOD PHOSPHATE PF 10 MG/ML IJ SOLN
INTRAMUSCULAR | Status: DC | PRN
  Administered 2024-06-13: 4 mg via INTRAVENOUS

## 2024-06-13 MED ORDER — MIDAZOLAM HCL (PF) 2 MG/2ML IJ SOLN
INTRAMUSCULAR | Status: DC | PRN
  Administered 2024-06-13: 2 mg via INTRAVENOUS

## 2024-06-13 MED ORDER — FENTANYL CITRATE (PF) 100 MCG/2ML IJ SOLN
INTRAMUSCULAR | Status: AC
  Filled 2024-06-13: qty 2

## 2024-06-13 MED ORDER — ORAL CARE MOUTH RINSE: 15.0000 mL | Freq: Once | OROMUCOSAL | Status: AC

## 2024-06-13 MED ORDER — DROPERIDOL 2.5 MG/ML IJ SOLN: 0.6250 mg | Freq: Once | INTRAMUSCULAR | Status: DC | PRN

## 2024-06-13 MED ORDER — PHENYLEPHRINE 80 MCG/ML (10ML) SYRINGE FOR IV PUSH (FOR BLOOD PRESSURE SUPPORT)
PREFILLED_SYRINGE | INTRAVENOUS | Status: DC | PRN
  Administered 2024-06-13: 160 ug via INTRAVENOUS

## 2024-06-13 MED ORDER — CEFAZOLIN SODIUM-DEXTROSE 3-4 GM/150ML-% IV SOLN
3.0000 g | INTRAVENOUS | Status: AC
  Administered 2024-06-13: 3 g via INTRAVENOUS
  Filled 2024-06-13: qty 150

## 2024-06-13 MED ORDER — FENTANYL CITRATE (PF) 100 MCG/2ML IJ SOLN
INTRAMUSCULAR | Status: DC | PRN
  Administered 2024-06-13: 50 ug via INTRAVENOUS

## 2024-06-13 MED ORDER — CEPHALEXIN 500 MG PO CAPS: 500.0000 mg | ORAL_CAPSULE | Freq: Every day | ORAL | 0 refills | Status: DC

## 2024-06-13 MED ORDER — TRAMADOL HCL 50 MG PO TABS: 50.0000 mg | ORAL_TABLET | Freq: Four times a day (QID) | ORAL | 0 refills | Status: DC | PRN

## 2024-06-13 MED ORDER — LIDOCAINE 2% (20 MG/ML) 5 ML SYRINGE: INTRAMUSCULAR | Status: DC | PRN

## 2024-06-13 MED ORDER — PROPOFOL 10 MG/ML IV BOLUS
INTRAVENOUS | Status: DC | PRN
  Administered 2024-06-13: 150 mg via INTRAVENOUS

## 2024-06-13 MED ORDER — ONDANSETRON HCL 4 MG/2ML IJ SOLN
INTRAMUSCULAR | Status: AC
  Filled 2024-06-13: qty 2

## 2024-06-13 MED ORDER — CHLORHEXIDINE GLUCONATE 0.12 % MT SOLN
15.0000 mL | Freq: Once | OROMUCOSAL | Status: AC
  Administered 2024-06-13: 15 mL via OROMUCOSAL

## 2024-06-13 MED ORDER — LIDOCAINE HCL (PF) 2 % IJ SOLN
INTRAMUSCULAR | Status: AC
  Filled 2024-06-13: qty 5

## 2024-06-13 MED ORDER — CEFAZOLIN SODIUM-DEXTROSE 2-4 GM/100ML-% IV SOLN: 2.0000 g | INTRAVENOUS | Status: DC

## 2024-06-13 MED ORDER — ACETAMINOPHEN 500 MG PO TABS
1000.0000 mg | ORAL_TABLET | Freq: Once | ORAL | Status: AC
  Administered 2024-06-13: 1000 mg via ORAL
  Filled 2024-06-13: qty 2

## 2024-06-13 MED ORDER — LIDOCAINE HCL (PF) 2 % IJ SOLN
INTRAMUSCULAR | Status: DC | PRN
  Administered 2024-06-13: 100 mg via INTRADERMAL

## 2024-06-13 MED ORDER — ONDANSETRON HCL 4 MG/2ML IJ SOLN
INTRAMUSCULAR | Status: DC | PRN
  Administered 2024-06-13: 4 mg via INTRAVENOUS

## 2024-06-13 MED ORDER — SODIUM CHLORIDE 0.9 % IR SOLN
Status: DC | PRN
  Administered 2024-06-13: 3000 mL via INTRAVESICAL

## 2024-06-13 MED ORDER — MIDAZOLAM HCL 2 MG/2ML IJ SOLN
INTRAMUSCULAR | Status: AC
  Filled 2024-06-13: qty 2

## 2024-06-13 MED ORDER — FENTANYL CITRATE (PF) 50 MCG/ML IJ SOSY: 25.0000 ug | PREFILLED_SYRINGE | INTRAMUSCULAR | Status: DC | PRN

## 2024-06-13 MED ORDER — LACTATED RINGERS IV SOLN: INTRAVENOUS | Status: DC

## 2024-06-13 MED ORDER — PROPOFOL 10 MG/ML IV BOLUS
INTRAVENOUS | Status: AC
  Filled 2024-06-13: qty 20

## 2024-06-13 SURGICAL SUPPLY — 15 items
BAG URINE DRAIN 2000ML AR STRL (UROLOGICAL SUPPLIES) IMPLANT
BAG URO CATCHER STRL LF (MISCELLANEOUS) ×1 IMPLANT
CLOTH BEACON ORANGE TIMEOUT ST (SAFETY) ×1 IMPLANT
DRAPE FOOT SWITCH (DRAPES) ×1 IMPLANT
ELECT REM PT RETURN 15FT ADLT (MISCELLANEOUS) IMPLANT
GLOVE BIO SURGEON STRL SZ 6.5 (GLOVE) ×1 IMPLANT
GOWN STRL REUS W/ TWL LRG LVL3 (GOWN DISPOSABLE) ×1 IMPLANT
KIT TURNOVER KIT A (KITS) ×1 IMPLANT
LOOP CUT BIPOLAR 24F LRG (ELECTROSURGICAL) IMPLANT
MANIFOLD NEPTUNE II (INSTRUMENTS) ×1 IMPLANT
PACK CYSTO (CUSTOM PROCEDURE TRAY) ×1 IMPLANT
PAD PREP 24X48 CUFFED NSTRL (MISCELLANEOUS) ×1 IMPLANT
SYRINGE TOOMEY IRRIG 70ML (MISCELLANEOUS) IMPLANT
TUBING CONNECTING 10 (TUBING) ×1 IMPLANT
TUBING UROLOGY SET (TUBING) ×1 IMPLANT

## 2024-06-13 NOTE — Transfer of Care (Signed)
 Immediate Anesthesia Transfer of Care Note  Patient: Christian Evans  Procedure(s) Performed: TURBT (TRANSURETHRAL RESECTION OF BLADDER TUMOR) (Bladder)  Patient Location: PACU  Anesthesia Type:General  Level of Consciousness: drowsy  Airway & Oxygen Therapy: Patient Spontanous Breathing and Patient connected to face mask oxygen  Post-op Assessment: Report given to RN, Post -op Vital signs reviewed and stable, and Patient moving all extremities X 4  Post vital signs: Reviewed and stable  Last Vitals:  Vitals Value Taken Time  BP 155/76   Temp    Pulse 63 06/13/24 08:01  Resp 16 06/13/24 08:01  SpO2 99 % 06/13/24 08:01  Vitals shown include unfiled device data.  Last Pain:  Vitals:   06/13/24 0656  TempSrc:   PainSc: 0-No pain      Patients Stated Pain Goal: 5 (06/13/24 0551)  Complications: No notable events documented.

## 2024-06-13 NOTE — Op Note (Signed)
 Operative Note  Preoperative diagnosis:  1.  Gross hematuria 2.  Abnormal bladder mucosa  Postoperative diagnosis: 1.  Gross hematuria 2.  Abnormal bladder mucosa  Procedure(s): 1.  Cystoscopy, bladder biopsy 2.  Fulguration  Surgeon: Valli Shank, MD  Assistants:  None  Anesthesia:  General  Complications:  None  EBL:  minimal  Specimens: 1. Bladder biopsy  Drains/Catheters: 1.  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 male with gross hematuria found to have abnormal bladder mucosa on diagnostic cystoscopy.  Description of procedure:  After risks and benefits of the procedure were discussed with the patient, informed consent was obtained.  The patient was taken to the operating room placed in the supine position.  Anesthesia was used and antibiotics were administered.  He was then repositioned in the dorsolithotomy vision.  He was prepped and draped in the usual sterile fashion and out was performed.  A 21 French rigid cystoscope was placed in the urethra meatus and advanced into the bladder under direct visualization.  The previous area of abnormal bladder mucosa was again visualized in the posterior superior bladder wall.  A cold cup bladder biopsy was taken from this area.  The cystoscope was then removed and resectoscope was advanced using the visual obturator.  The bipolar electrocautery loop was assembled.  The abnormal bladder mucosa was then fulgurated with bipolar electrocautery loop.  Hemostasis adequate with the irrigant turned off.  Patient had bilateral orthotopic ureteral orifices that were uninvolved.  The patient's bladder was decompressed and the resectoscope was removed.  The patient emerged from anesthesia  and was transferred back in stable condition.  Plan:  discharge home

## 2024-06-13 NOTE — Anesthesia Preprocedure Evaluation (Addendum)
"                                    Anesthesia Evaluation  Patient identified by MRN, date of birth, ID band Patient awake    Reviewed: Allergy & Precautions, NPO status , Patient's Chart, lab work & pertinent test results  Airway Mallampati: II  TM Distance: >3 FB Neck ROM: Full    Dental  (+) Edentulous Upper   Pulmonary pneumonia, neg recent URI, former smoker   Pulmonary exam normal breath sounds clear to auscultation       Cardiovascular hypertension, Pt. on medications + CAD, + Cardiac Stents and + Peripheral Vascular Disease  Normal cardiovascular exam(-) dysrhythmias  Rhythm:Regular Rate:Normal  Echo 03/2024  1. Left ventricular ejection fraction, by estimation, is 60%. Left  ventricular ejection fraction by 3D volume is 59 %. The left ventricle has  normal function. The left ventricle has no regional wall motion  abnormalities. There is mild concentric left  ventricular hypertrophy. Left ventricular diastolic parameters are  consistent with Grade I diastolic dysfunction (impaired relaxation). The  average left ventricular global longitudinal strain is -24.1 %. The global  longitudinal strain is normal.   2. Right ventricular systolic function is normal. The right ventricular  size is mildly enlarged.   3. The mitral valve is normal in structure. No evidence of mitral valve  regurgitation. No evidence of mitral stenosis.   4. The aortic valve is tricuspid. Aortic valve regurgitation is not  visualized. No aortic stenosis is present.   Comparison(s): Unable to view 2012 study.     Neuro/Psych negative neurological ROS     GI/Hepatic Neg liver ROS,GERD  Controlled,,  Endo/Other    Class 3 obesity  Renal/GU negative Renal ROS     Musculoskeletal  (+) Arthritis ,    Abdominal  (+) + obese  Peds  Hematology negative hematology ROS (+)   Anesthesia Other Findings   Reproductive/Obstetrics                               Anesthesia Physical Anesthesia Plan  ASA: 3  Anesthesia Plan: General   Post-op Pain Management: Tylenol  PO (pre-op)*   Induction: Intravenous  PONV Risk Score and Plan: 3 and Ondansetron , Dexamethasone  and Treatment may vary due to age or medical condition  Airway Management Planned: Oral ETT and LMA  Additional Equipment:   Intra-op Plan:   Post-operative Plan: Extubation in OR  Informed Consent: I have reviewed the patients History and Physical, chart, labs and discussed the procedure including the risks, benefits and alternatives for the proposed anesthesia with the patient or authorized representative who has indicated his/her understanding and acceptance.     Dental advisory given  Plan Discussed with: CRNA  Anesthesia Plan Comments:          Anesthesia Quick Evaluation  "

## 2024-06-13 NOTE — Anesthesia Procedure Notes (Signed)
 Procedure Name: LMA Insertion Date/Time: 06/13/2024 7:36 AM  Performed by: Brandy Almarie BROCKS, CRNAPre-anesthesia Checklist: Patient identified, Emergency Drugs available, Suction available and Patient being monitored Patient Re-evaluated:Patient Re-evaluated prior to induction Oxygen Delivery Method: Circle system utilized Preoxygenation: Pre-oxygenation with 100% oxygen Induction Type: IV induction LMA: LMA with gastric port inserted LMA Size: 4.0 Number of attempts: 1 Dental Injury: Teeth and Oropharynx as per pre-operative assessment

## 2024-06-13 NOTE — H&P (Signed)
 "   History of present illness: 73 year old man who initially presented with gross hematuria found to have abnormal bladder mucosa on cystoscopy.  He is here today for cystoscopy with bladder biopsy.  Urinalysis was done yesterday and did not show any bacteria.   Review of systems: A 12 point comprehensive review of systems was obtained and is negative unless otherwise stated in the history of present illness.  Patient Active Problem List   Diagnosis Date Noted   Tobacco abuse 03/14/2024   Essential hypertension 03/14/2024   Hyperlipidemia 03/14/2024   Coronary artery disease 03/14/2024   Morbid obesity (HCC) 03/14/2024   Preoperative clearance 03/14/2024    Medications Ordered Prior to Encounter[1]  Past Medical History:  Diagnosis Date   Arthritis    Chronic edema    lower Extremity   Coronary artery disease    GERD (gastroesophageal reflux disease)    Gout    Hx of angina pectoris    Hypertension    Obesity    Peripheral vascular disease    Pneumonia    Pre-diabetes     Past Surgical History:  Procedure Laterality Date   ARM SURGERY Right 1978   ORIF elbow from 3- story fall   CORONARY STENT PLACEMENT  1998   done at [facility], per patient    DES x 1   CYST REMOVAL TRUNK     benign cyst   FOOT SURGERY Left 1978   ORIF heel / ankle    Social History[2]  Family History  Problem Relation Age of Onset   Heart attack Mother 30   Heart attack Father 53   Hypertension Brother    Hypertension Sister    Colon cancer Neg Hx    Stomach cancer Neg Hx    Rectal cancer Neg Hx    Esophageal cancer Neg Hx    Liver cancer Neg Hx     PE: Vitals:   06/13/24 0547 06/13/24 0551  BP: (!) 166/71   Pulse: 66   Resp: 16   Temp: 97.7 F (36.5 C)   TempSrc: Oral   SpO2: 96%   Weight:  123.5 kg  Height:  5' 10 (1.778 m)   Patient appears to be in no acute distress  patient is alert and oriented x3 Atraumatic normocephalic head No increased work of breathing, no  audible wheezes/rhonchi Grossly neurologically intact No identifiable skin lesions  No results for input(s): WBC, HGB, HCT in the last 72 hours. Recent Labs    06/12/24 0849  NA 138  K 3.5  CL 95*  CO2 28  GLUCOSE 132*  BUN 7*  CREATININE 0.78  CALCIUM 9.3   No results for input(s): LABPT, INR in the last 72 hours. No results for input(s): LABURIN in the last 72 hours. Results for orders placed or performed during the hospital encounter of 06/06/23  Resp panel by RT-PCR (RSV, Flu A&B, Covid) Anterior Nasal Swab     Status: None   Collection Time: 06/06/23  4:47 AM   Specimen: Anterior Nasal Swab  Result Value Ref Range Status   SARS Coronavirus 2 by RT PCR NEGATIVE NEGATIVE Final   Influenza A by PCR NEGATIVE NEGATIVE Final   Influenza B by PCR NEGATIVE NEGATIVE Final    Comment: (NOTE) The Xpert Xpress SARS-CoV-2/FLU/RSV plus assay is intended as an aid in the diagnosis of influenza from Nasopharyngeal swab specimens and should not be used as a sole basis for treatment. Nasal washings and aspirates are unacceptable for Xpert  Xpress SARS-CoV-2/FLU/RSV testing.  Fact Sheet for Patients: bloggercourse.com  Fact Sheet for Healthcare Providers: seriousbroker.it  This test is not yet approved or cleared by the [place]  FDA and has been authorized for detection and/or diagnosis of SARS-CoV-2 by FDA under an Emergency Use Authorization (EUA). This EUA will remain in effect (meaning this test can be used) for the duration of the COVID-19 declaration under Section 564(b)(1) of the Act, 21 U.S.C. section 360bbb-3(b)(1), unless the authorization is terminated or revoked.     Resp Syncytial Virus by PCR NEGATIVE NEGATIVE Final    Comment: (NOTE) Fact Sheet for Patients: bloggercourse.com  Fact Sheet for Healthcare Providers: seriousbroker.it  This test is not  yet approved or cleared by the [place]  FDA and has been authorized for detection and/or diagnosis of SARS-CoV-2 by FDA under an Emergency Use Authorization (EUA). This EUA will remain in effect (meaning this test can be used) for the duration of the COVID-19 declaration under Section 564(b)(1) of the Act, 21 U.S.C. section 360bbb-3(b)(1), unless the authorization is terminated or revoked.  Performed at [facility], [address], [place], [place] [address]    A/P: Abnormal bladder mucosa -Risks and benefits of cystoscopy with bladder biopsy discussed with patient including but not limited to pain, bleeding, infection, bladder perforation, increased LUTS, need for Foley catheter, damage to surrounding structures  Informed consent obtained and surgery scheduled today   Ceilidh Torregrossa D Yarixa Lightcap       [1]  No current facility-administered medications on file prior to encounter.   Current Outpatient Medications on File Prior to Encounter  Medication Sig Dispense Refill   allopurinol (ZYLOPRIM) 300 MG tablet Take 300 mg by mouth daily.     aspirin 325 MG tablet Take 325 mg by mouth every evening.     chlorthalidone (HYGROTON) 25 MG tablet Take 25 mg by mouth at bedtime.     diphenhydramine-acetaminophen  (TYLENOL  PM) 25-500 MG TABS tablet Take 1 tablet by mouth at bedtime as needed (sleep).     felodipine (PLENDIL) 10 MG 24 hr tablet Take 10 mg by mouth daily.     losartan (COZAAR) 100 MG tablet Take 100 mg by mouth daily.     metoprolol succinate (TOPROL-XL) 50 MG 24 hr tablet Take 50 mg by mouth daily. Take with or immediately following a meal.     rosuvastatin (CRESTOR) 10 MG tablet Take 10 mg by mouth daily.     tamsulosin (FLOMAX) 0.4 MG CAPS capsule Take 0.4 mg by mouth daily.     MELATONIN PO Take 1 tablet by mouth at bedtime as needed (sleep).     [DISCONTINUED] omeprazole (PRILOSEC) 20 MG capsule Take 20 mg by mouth daily.     [DISCONTINUED] testosterone (ANDRODERM)  2.5 MG/24HR Place 1 patch onto the skin daily.    [2]  Social History Tobacco Use   Smoking status: Former    Types: Cigarettes    Passive exposure: Never   Smokeless tobacco: Never  Vaping Use   Vaping status: Never Used  Substance Use Topics   Alcohol use: Yes    Comment: occ   Drug use: No   "

## 2024-06-13 NOTE — Discharge Instructions (Signed)
 Bladder biopsy   Definition:  Removal of abnormal appearing tissue in the bladder  General instructions:     Your recent bladder surgery requires very little post hospital care but some definite precautions.  Despite the fact that no skin incisions were used, the area around the bladder incisions are raw and covered with scabs to promote healing and prevent bleeding. Certain precautions are needed to insure that the scabs are not disturbed over the next 2-4 weeks while the healing proceeds.  Because the raw surface inside your bladder and the irritating effects of urine you may expect frequency of urination and/or urgency (a stronger desire to urinate) and perhaps even getting up at night more often. This will usually resolve or improve slowly over the healing period. You may see some blood in your urine over the first 6 weeks. Do not be alarmed, even if the urine was clear for a while. Get off your feet and drink lots of fluids until clearing occurs. If you start to pass clots or don't improve call us .  Catheter: (If you are discharged with a catheter.)  1. Keep your catheter secured to your leg at all times with tape or the supplied strap. 2. You may experience leakage of urine around your catheter- as long as the  catheter continues to drain, this is normal.  If your catheter stops draining  go to the ER. 3. You may also have blood in your urine, even after it has been clear for  several days; you may even pass some small blood clots or other material.  This  is normal as well.  If this happens, sit down and drink plenty of water to help  make urine to flush out your bladder.  If the blood in your urine becomes worse  after doing this, contact our office or return to the ER. 4. You may use the leg bag (small bag) during the day, but use the large bag at  night.  Diet:  You may return to your normal diet immediately. Because of the raw surface of your bladder, alcohol, spicy foods,  foods high in acid and drinks with caffeine may cause irritation or frequency and should be used in moderation. To keep your urine flowing freely and avoid constipation, drink plenty of fluids during the day (8-10 glasses). Tip: Avoid cranberry juice because it is very acidic.  Activity:  Your physical activity doesn't need to be restricted. However, if you are very active, you may see some blood in the urine. We suggest that you reduce your activity under the circumstances until the bleeding has stopped.  Bowels:  It is important to keep your bowels regular during the postoperative period. Straining with bowel movements can cause bleeding. A bowel movement every other day is reasonable. Use a mild laxative if needed, such as milk of magnesia 2-3 tablespoons, or 2 Dulcolax tablets. Call if you continue to have problems. If you had been taking narcotics for pain, before, during or after your surgery, you may be constipated. Take a laxative if necessary.    Medication:  You should resume your pre-surgery medications unless told not to. In addition you may be given an antibiotic to prevent or treat infection. Antibiotics are not always necessary. All medication should be taken as prescribed until the bottles are finished unless you are having an unusual reaction to one of the drugs.

## 2024-06-13 NOTE — Anesthesia Postprocedure Evaluation (Signed)
"   Anesthesia Post Note  Patient: Christian Evans  Procedure(s) Performed: TURBT (TRANSURETHRAL RESECTION OF BLADDER TUMOR) (Bladder)     Patient location during evaluation: PACU Anesthesia Type: General Level of consciousness: sedated and patient cooperative Pain management: pain level controlled Vital Signs Assessment: post-procedure vital signs reviewed and stable Respiratory status: spontaneous breathing Cardiovascular status: stable Anesthetic complications: no   No notable events documented.  Last Vitals:  Vitals:   06/13/24 0904 06/13/24 0915  BP: (!) 143/76 (!) 143/77  Pulse: 67   Resp: 20 18  Temp: 36.4 C   SpO2: 94% 94%    Last Pain:  Vitals:   06/13/24 0915  TempSrc:   PainSc: 0-No pain                 Norleen Pope      "

## 2024-06-14 ENCOUNTER — Encounter (HOSPITAL_COMMUNITY): Payer: Self-pay | Admitting: Urology

## 2024-06-19 LAB — SURGICAL PATHOLOGY

## 2024-07-20 ENCOUNTER — Other Ambulatory Visit: Payer: Self-pay | Admitting: Urology

## 2024-08-03 ENCOUNTER — Encounter (HOSPITAL_COMMUNITY): Admission: RE | Admit: 2024-08-03

## 2024-08-10 ENCOUNTER — Ambulatory Visit (HOSPITAL_COMMUNITY): Admit: 2024-08-10 | Admitting: Urology
